# Patient Record
Sex: Female | Born: 1945 | Race: White | Hispanic: No | Marital: Married | Smoking: Never smoker
Health system: Southern US, Community
[De-identification: ages and names within clinical notes are randomized; demographics above are authoritative.]

## PROBLEM LIST (undated history)

## (undated) DIAGNOSIS — H409 Unspecified glaucoma: Secondary | ICD-10-CM

## (undated) DIAGNOSIS — G20 Parkinson's disease: Secondary | ICD-10-CM

## (undated) DIAGNOSIS — G20A1 Parkinson's disease without dyskinesia, without mention of fluctuations: Secondary | ICD-10-CM

## (undated) DIAGNOSIS — G450 Vertebro-basilar artery syndrome: Secondary | ICD-10-CM

## (undated) HISTORY — PX: OTHER SURGICAL HISTORY: SHX169

## (undated) HISTORY — PX: DILATION AND CURETTAGE OF UTERUS: SHX78

## (undated) HISTORY — DX: Parkinson's disease without dyskinesia, without mention of fluctuations: G20.A1

## (undated) HISTORY — PX: ABDOMINAL HYSTERECTOMY: SHX81

## (undated) HISTORY — PX: BREAST BIOPSY: SHX20

## (undated) HISTORY — PX: TONSILLECTOMY: SUR1361

## (undated) HISTORY — DX: Parkinson's disease: G20

## (undated) HISTORY — DX: Unspecified glaucoma: H40.9

## (undated) HISTORY — DX: Vertebro-basilar artery syndrome: G45.0

---

## 1992-05-15 HISTORY — PX: BREAST EXCISIONAL BIOPSY: SUR124

## 1993-05-15 HISTORY — PX: BREAST EXCISIONAL BIOPSY: SUR124

## 1998-07-06 ENCOUNTER — Inpatient Hospital Stay (HOSPITAL_COMMUNITY): Admission: AD | Admit: 1998-07-06 | Discharge: 1998-07-08 | Payer: Self-pay | Admitting: *Deleted

## 1998-07-06 ENCOUNTER — Encounter: Payer: Self-pay | Admitting: *Deleted

## 1998-07-07 ENCOUNTER — Encounter: Payer: Self-pay | Admitting: *Deleted

## 2000-02-22 ENCOUNTER — Encounter: Admission: RE | Admit: 2000-02-22 | Discharge: 2000-02-22 | Payer: Self-pay | Admitting: General Surgery

## 2000-02-22 ENCOUNTER — Encounter: Payer: Self-pay | Admitting: General Surgery

## 2000-03-01 ENCOUNTER — Other Ambulatory Visit: Admission: RE | Admit: 2000-03-01 | Discharge: 2000-03-01 | Payer: Self-pay | Admitting: Gynecology

## 2001-02-20 ENCOUNTER — Encounter: Admission: RE | Admit: 2001-02-20 | Discharge: 2001-02-20 | Payer: Self-pay | Admitting: Gynecology

## 2001-02-20 ENCOUNTER — Encounter: Payer: Self-pay | Admitting: Gynecology

## 2002-01-22 ENCOUNTER — Encounter: Admission: RE | Admit: 2002-01-22 | Discharge: 2002-01-22 | Payer: Self-pay | Admitting: Gynecology

## 2002-01-22 ENCOUNTER — Encounter: Payer: Self-pay | Admitting: Gynecology

## 2003-01-27 ENCOUNTER — Encounter: Payer: Self-pay | Admitting: Gynecology

## 2003-01-27 ENCOUNTER — Encounter: Admission: RE | Admit: 2003-01-27 | Discharge: 2003-01-27 | Payer: Self-pay | Admitting: Gynecology

## 2003-02-03 ENCOUNTER — Other Ambulatory Visit: Admission: RE | Admit: 2003-02-03 | Discharge: 2003-02-03 | Payer: Self-pay | Admitting: Obstetrics and Gynecology

## 2004-01-28 ENCOUNTER — Encounter: Admission: RE | Admit: 2004-01-28 | Discharge: 2004-01-28 | Payer: Self-pay | Admitting: Gynecology

## 2005-02-01 ENCOUNTER — Encounter: Admission: RE | Admit: 2005-02-01 | Discharge: 2005-02-01 | Payer: Self-pay | Admitting: Gynecology

## 2005-09-08 ENCOUNTER — Ambulatory Visit: Payer: Self-pay | Admitting: Internal Medicine

## 2006-01-22 ENCOUNTER — Encounter: Admission: RE | Admit: 2006-01-22 | Discharge: 2006-01-22 | Payer: Self-pay | Admitting: Gynecology

## 2007-01-23 ENCOUNTER — Encounter: Admission: RE | Admit: 2007-01-23 | Discharge: 2007-01-23 | Payer: Self-pay | Admitting: Gynecology

## 2008-01-22 ENCOUNTER — Encounter: Admission: RE | Admit: 2008-01-22 | Discharge: 2008-01-22 | Payer: Self-pay | Admitting: Gynecology

## 2009-01-22 ENCOUNTER — Encounter: Admission: RE | Admit: 2009-01-22 | Discharge: 2009-01-22 | Payer: Self-pay | Admitting: Gynecology

## 2010-01-28 ENCOUNTER — Encounter: Admission: RE | Admit: 2010-01-28 | Discharge: 2010-01-28 | Payer: Self-pay | Admitting: Gynecology

## 2010-12-23 ENCOUNTER — Other Ambulatory Visit: Payer: Self-pay | Admitting: Gynecology

## 2010-12-23 DIAGNOSIS — Z1231 Encounter for screening mammogram for malignant neoplasm of breast: Secondary | ICD-10-CM

## 2011-01-30 ENCOUNTER — Ambulatory Visit
Admission: RE | Admit: 2011-01-30 | Discharge: 2011-01-30 | Disposition: A | Discharge status: Home / Self Care | Payer: BC Managed Care – PPO | Source: Ambulatory Visit | Attending: Gynecology | Admitting: Gynecology

## 2011-01-30 DIAGNOSIS — Z1231 Encounter for screening mammogram for malignant neoplasm of breast: Secondary | ICD-10-CM

## 2012-02-08 ENCOUNTER — Other Ambulatory Visit: Payer: Self-pay | Admitting: Gynecology

## 2012-02-08 DIAGNOSIS — Z1231 Encounter for screening mammogram for malignant neoplasm of breast: Secondary | ICD-10-CM

## 2012-02-26 ENCOUNTER — Ambulatory Visit: Payer: BC Managed Care – PPO

## 2012-02-28 ENCOUNTER — Ambulatory Visit
Admission: RE | Admit: 2012-02-28 | Discharge: 2012-02-28 | Disposition: A | Discharge status: Home / Self Care | Payer: BC Managed Care – PPO | Source: Ambulatory Visit | Attending: Gynecology | Admitting: Gynecology

## 2012-02-28 DIAGNOSIS — Z1231 Encounter for screening mammogram for malignant neoplasm of breast: Secondary | ICD-10-CM

## 2012-03-01 ENCOUNTER — Other Ambulatory Visit: Payer: Self-pay | Admitting: Gynecology

## 2012-03-01 DIAGNOSIS — R928 Other abnormal and inconclusive findings on diagnostic imaging of breast: Secondary | ICD-10-CM

## 2012-03-05 ENCOUNTER — Ambulatory Visit
Admission: RE | Admit: 2012-03-05 | Discharge: 2012-03-05 | Disposition: A | Discharge status: Home / Self Care | Payer: BC Managed Care – PPO | Source: Ambulatory Visit | Attending: Gynecology | Admitting: Gynecology

## 2012-03-05 DIAGNOSIS — R928 Other abnormal and inconclusive findings on diagnostic imaging of breast: Secondary | ICD-10-CM

## 2012-10-24 ENCOUNTER — Other Ambulatory Visit: Payer: Self-pay | Admitting: Gynecology

## 2012-10-24 DIAGNOSIS — N644 Mastodynia: Secondary | ICD-10-CM

## 2012-11-05 ENCOUNTER — Ambulatory Visit
Admission: RE | Admit: 2012-11-05 | Discharge: 2012-11-05 | Disposition: A | Discharge status: Home / Self Care | Payer: BC Managed Care – PPO | Source: Ambulatory Visit | Attending: Gynecology | Admitting: Gynecology

## 2012-11-05 DIAGNOSIS — N644 Mastodynia: Secondary | ICD-10-CM

## 2012-11-06 ENCOUNTER — Other Ambulatory Visit: Payer: BC Managed Care – PPO

## 2012-12-23 ENCOUNTER — Encounter: Payer: Self-pay | Admitting: Internal Medicine

## 2013-02-03 ENCOUNTER — Other Ambulatory Visit: Payer: Self-pay

## 2013-02-03 DIAGNOSIS — Z1231 Encounter for screening mammogram for malignant neoplasm of breast: Secondary | ICD-10-CM

## 2013-03-06 ENCOUNTER — Other Ambulatory Visit: Payer: Self-pay | Admitting: Gynecology

## 2013-03-06 DIAGNOSIS — M8580 Other specified disorders of bone density and structure, unspecified site: Secondary | ICD-10-CM

## 2013-03-20 ENCOUNTER — Ambulatory Visit: Payer: BC Managed Care – PPO

## 2013-04-15 ENCOUNTER — Ambulatory Visit
Admission: RE | Admit: 2013-04-15 | Discharge: 2013-04-15 | Disposition: A | Discharge status: Home / Self Care | Payer: Medicare Other | Source: Ambulatory Visit | Attending: Gynecology | Admitting: Gynecology

## 2013-04-15 ENCOUNTER — Ambulatory Visit
Admission: RE | Admit: 2013-04-15 | Discharge: 2013-04-15 | Disposition: A | Discharge status: Home / Self Care | Payer: Medicare Other | Source: Ambulatory Visit

## 2013-04-15 DIAGNOSIS — M8580 Other specified disorders of bone density and structure, unspecified site: Secondary | ICD-10-CM

## 2013-04-15 DIAGNOSIS — Z1231 Encounter for screening mammogram for malignant neoplasm of breast: Secondary | ICD-10-CM

## 2013-10-12 ENCOUNTER — Encounter: Payer: Self-pay | Admitting: Internal Medicine

## 2014-03-25 ENCOUNTER — Other Ambulatory Visit: Payer: Self-pay

## 2014-03-25 DIAGNOSIS — Z1231 Encounter for screening mammogram for malignant neoplasm of breast: Secondary | ICD-10-CM

## 2014-04-16 ENCOUNTER — Encounter (INDEPENDENT_AMBULATORY_CARE_PROVIDER_SITE_OTHER): Payer: Self-pay

## 2014-04-16 ENCOUNTER — Ambulatory Visit
Admission: RE | Admit: 2014-04-16 | Discharge: 2014-04-16 | Disposition: A | Discharge status: Home / Self Care | Payer: Medicare Other | Source: Ambulatory Visit

## 2014-04-16 DIAGNOSIS — Z1231 Encounter for screening mammogram for malignant neoplasm of breast: Secondary | ICD-10-CM

## 2015-02-17 ENCOUNTER — Other Ambulatory Visit: Payer: Self-pay

## 2015-02-17 DIAGNOSIS — Z1231 Encounter for screening mammogram for malignant neoplasm of breast: Secondary | ICD-10-CM

## 2015-04-19 ENCOUNTER — Ambulatory Visit
Admission: RE | Admit: 2015-04-19 | Discharge: 2015-04-19 | Disposition: A | Discharge status: Home / Self Care | Payer: Commercial Managed Care - HMO | Source: Ambulatory Visit

## 2015-04-19 DIAGNOSIS — Z1231 Encounter for screening mammogram for malignant neoplasm of breast: Secondary | ICD-10-CM

## 2015-06-24 DIAGNOSIS — J329 Chronic sinusitis, unspecified: Secondary | ICD-10-CM | POA: Diagnosis not present

## 2015-06-24 DIAGNOSIS — L239 Allergic contact dermatitis, unspecified cause: Secondary | ICD-10-CM | POA: Diagnosis not present

## 2015-09-03 DIAGNOSIS — J329 Chronic sinusitis, unspecified: Secondary | ICD-10-CM | POA: Diagnosis not present

## 2015-09-03 DIAGNOSIS — R609 Edema, unspecified: Secondary | ICD-10-CM | POA: Diagnosis not present

## 2015-09-03 DIAGNOSIS — H6122 Impacted cerumen, left ear: Secondary | ICD-10-CM | POA: Diagnosis not present

## 2015-09-03 DIAGNOSIS — R42 Dizziness and giddiness: Secondary | ICD-10-CM | POA: Diagnosis not present

## 2015-09-15 DIAGNOSIS — R42 Dizziness and giddiness: Secondary | ICD-10-CM | POA: Diagnosis not present

## 2015-09-15 DIAGNOSIS — R609 Edema, unspecified: Secondary | ICD-10-CM | POA: Diagnosis not present

## 2015-09-15 DIAGNOSIS — H10019 Acute follicular conjunctivitis, unspecified eye: Secondary | ICD-10-CM | POA: Diagnosis not present

## 2015-10-04 DIAGNOSIS — H268 Other specified cataract: Secondary | ICD-10-CM | POA: Diagnosis not present

## 2015-11-13 DIAGNOSIS — J0100 Acute maxillary sinusitis, unspecified: Secondary | ICD-10-CM | POA: Diagnosis not present

## 2016-01-10 DIAGNOSIS — H268 Other specified cataract: Secondary | ICD-10-CM | POA: Diagnosis not present

## 2016-01-12 DIAGNOSIS — R2681 Unsteadiness on feet: Secondary | ICD-10-CM | POA: Diagnosis not present

## 2016-01-12 DIAGNOSIS — M810 Age-related osteoporosis without current pathological fracture: Secondary | ICD-10-CM | POA: Diagnosis not present

## 2016-01-12 DIAGNOSIS — Z9181 History of falling: Secondary | ICD-10-CM | POA: Diagnosis not present

## 2016-01-12 DIAGNOSIS — Z0000 Encounter for general adult medical examination without abnormal findings: Secondary | ICD-10-CM | POA: Diagnosis not present

## 2016-01-12 DIAGNOSIS — J329 Chronic sinusitis, unspecified: Secondary | ICD-10-CM | POA: Diagnosis not present

## 2016-01-12 DIAGNOSIS — G44209 Tension-type headache, unspecified, not intractable: Secondary | ICD-10-CM | POA: Diagnosis not present

## 2016-01-12 DIAGNOSIS — B9689 Other specified bacterial agents as the cause of diseases classified elsewhere: Secondary | ICD-10-CM | POA: Diagnosis not present

## 2016-01-12 DIAGNOSIS — Z1389 Encounter for screening for other disorder: Secondary | ICD-10-CM | POA: Diagnosis not present

## 2016-02-15 DIAGNOSIS — Z9181 History of falling: Secondary | ICD-10-CM | POA: Diagnosis not present

## 2016-02-15 DIAGNOSIS — G20 Parkinson's disease: Secondary | ICD-10-CM | POA: Diagnosis not present

## 2016-02-15 DIAGNOSIS — R29898 Other symptoms and signs involving the musculoskeletal system: Secondary | ICD-10-CM | POA: Diagnosis not present

## 2016-02-15 DIAGNOSIS — G450 Vertebro-basilar artery syndrome: Secondary | ICD-10-CM | POA: Diagnosis not present

## 2016-02-15 DIAGNOSIS — I771 Stricture of artery: Secondary | ICD-10-CM | POA: Diagnosis not present

## 2016-02-15 DIAGNOSIS — R2681 Unsteadiness on feet: Secondary | ICD-10-CM | POA: Diagnosis not present

## 2016-03-06 DIAGNOSIS — H268 Other specified cataract: Secondary | ICD-10-CM | POA: Diagnosis not present

## 2016-03-13 ENCOUNTER — Other Ambulatory Visit: Payer: Self-pay | Admitting: Obstetrics and Gynecology

## 2016-03-13 DIAGNOSIS — Z1231 Encounter for screening mammogram for malignant neoplasm of breast: Secondary | ICD-10-CM

## 2016-03-16 DIAGNOSIS — I998 Other disorder of circulatory system: Secondary | ICD-10-CM | POA: Diagnosis not present

## 2016-03-16 DIAGNOSIS — M79605 Pain in left leg: Secondary | ICD-10-CM | POA: Diagnosis not present

## 2016-03-16 DIAGNOSIS — M79604 Pain in right leg: Secondary | ICD-10-CM | POA: Diagnosis not present

## 2016-03-19 DIAGNOSIS — K2900 Acute gastritis without bleeding: Secondary | ICD-10-CM | POA: Diagnosis not present

## 2016-03-19 DIAGNOSIS — R079 Chest pain, unspecified: Secondary | ICD-10-CM | POA: Diagnosis not present

## 2016-03-19 DIAGNOSIS — R109 Unspecified abdominal pain: Secondary | ICD-10-CM | POA: Diagnosis not present

## 2016-03-19 DIAGNOSIS — R1013 Epigastric pain: Secondary | ICD-10-CM | POA: Diagnosis not present

## 2016-04-19 ENCOUNTER — Ambulatory Visit
Admission: RE | Admit: 2016-04-19 | Discharge: 2016-04-19 | Disposition: A | Discharge status: Home / Self Care | Payer: PPO | Source: Ambulatory Visit | Attending: Obstetrics and Gynecology | Admitting: Obstetrics and Gynecology

## 2016-04-19 DIAGNOSIS — Z1231 Encounter for screening mammogram for malignant neoplasm of breast: Secondary | ICD-10-CM | POA: Diagnosis not present

## 2016-05-02 DIAGNOSIS — R1013 Epigastric pain: Secondary | ICD-10-CM | POA: Diagnosis not present

## 2016-05-02 DIAGNOSIS — K219 Gastro-esophageal reflux disease without esophagitis: Secondary | ICD-10-CM | POA: Diagnosis not present

## 2016-05-02 DIAGNOSIS — R112 Nausea with vomiting, unspecified: Secondary | ICD-10-CM | POA: Diagnosis not present

## 2016-05-02 DIAGNOSIS — K5900 Constipation, unspecified: Secondary | ICD-10-CM | POA: Diagnosis not present

## 2016-05-16 DIAGNOSIS — G450 Vertebro-basilar artery syndrome: Secondary | ICD-10-CM | POA: Diagnosis not present

## 2016-05-16 DIAGNOSIS — Z9181 History of falling: Secondary | ICD-10-CM | POA: Diagnosis not present

## 2016-05-16 DIAGNOSIS — G20 Parkinson's disease: Secondary | ICD-10-CM | POA: Diagnosis not present

## 2016-05-16 DIAGNOSIS — R2681 Unsteadiness on feet: Secondary | ICD-10-CM | POA: Diagnosis not present

## 2016-06-14 DIAGNOSIS — R1013 Epigastric pain: Secondary | ICD-10-CM | POA: Diagnosis not present

## 2016-06-14 DIAGNOSIS — K5790 Diverticulosis of intestine, part unspecified, without perforation or abscess without bleeding: Secondary | ICD-10-CM | POA: Diagnosis not present

## 2016-06-14 DIAGNOSIS — K589 Irritable bowel syndrome without diarrhea: Secondary | ICD-10-CM | POA: Diagnosis not present

## 2016-06-14 DIAGNOSIS — K219 Gastro-esophageal reflux disease without esophagitis: Secondary | ICD-10-CM | POA: Diagnosis not present

## 2016-06-14 DIAGNOSIS — K2950 Unspecified chronic gastritis without bleeding: Secondary | ICD-10-CM | POA: Diagnosis not present

## 2016-06-14 DIAGNOSIS — R112 Nausea with vomiting, unspecified: Secondary | ICD-10-CM | POA: Diagnosis not present

## 2016-06-14 DIAGNOSIS — K2900 Acute gastritis without bleeding: Secondary | ICD-10-CM | POA: Diagnosis not present

## 2016-06-14 DIAGNOSIS — K2970 Gastritis, unspecified, without bleeding: Secondary | ICD-10-CM | POA: Diagnosis not present

## 2016-06-14 DIAGNOSIS — D130 Benign neoplasm of esophagus: Secondary | ICD-10-CM | POA: Diagnosis not present

## 2016-06-14 DIAGNOSIS — K5900 Constipation, unspecified: Secondary | ICD-10-CM | POA: Diagnosis not present

## 2016-06-14 DIAGNOSIS — M8580 Other specified disorders of bone density and structure, unspecified site: Secondary | ICD-10-CM | POA: Diagnosis not present

## 2016-06-14 DIAGNOSIS — K317 Polyp of stomach and duodenum: Secondary | ICD-10-CM | POA: Diagnosis not present

## 2016-06-14 DIAGNOSIS — R0789 Other chest pain: Secondary | ICD-10-CM | POA: Diagnosis not present

## 2016-07-10 DIAGNOSIS — H268 Other specified cataract: Secondary | ICD-10-CM | POA: Diagnosis not present

## 2016-08-23 DIAGNOSIS — J40 Bronchitis, not specified as acute or chronic: Secondary | ICD-10-CM | POA: Diagnosis not present

## 2016-08-23 DIAGNOSIS — Z6825 Body mass index (BMI) 25.0-25.9, adult: Secondary | ICD-10-CM | POA: Diagnosis not present

## 2016-08-23 DIAGNOSIS — J329 Chronic sinusitis, unspecified: Secondary | ICD-10-CM | POA: Diagnosis not present

## 2016-08-23 DIAGNOSIS — N951 Menopausal and female climacteric states: Secondary | ICD-10-CM | POA: Diagnosis not present

## 2016-08-24 DIAGNOSIS — S0990XA Unspecified injury of head, initial encounter: Secondary | ICD-10-CM | POA: Diagnosis not present

## 2016-09-04 DIAGNOSIS — H01002 Unspecified blepharitis right lower eyelid: Secondary | ICD-10-CM | POA: Diagnosis not present

## 2016-09-04 DIAGNOSIS — H01004 Unspecified blepharitis left upper eyelid: Secondary | ICD-10-CM | POA: Diagnosis not present

## 2016-09-04 DIAGNOSIS — H01001 Unspecified blepharitis right upper eyelid: Secondary | ICD-10-CM | POA: Diagnosis not present

## 2016-09-04 DIAGNOSIS — H01005 Unspecified blepharitis left lower eyelid: Secondary | ICD-10-CM | POA: Diagnosis not present

## 2016-09-22 DIAGNOSIS — H268 Other specified cataract: Secondary | ICD-10-CM | POA: Diagnosis not present

## 2016-10-22 DIAGNOSIS — J0100 Acute maxillary sinusitis, unspecified: Secondary | ICD-10-CM | POA: Diagnosis not present

## 2016-11-02 DIAGNOSIS — G450 Vertebro-basilar artery syndrome: Secondary | ICD-10-CM | POA: Diagnosis not present

## 2016-11-02 DIAGNOSIS — M533 Sacrococcygeal disorders, not elsewhere classified: Secondary | ICD-10-CM | POA: Diagnosis not present

## 2016-11-02 DIAGNOSIS — R2681 Unsteadiness on feet: Secondary | ICD-10-CM | POA: Diagnosis not present

## 2016-11-02 DIAGNOSIS — G20 Parkinson's disease: Secondary | ICD-10-CM | POA: Diagnosis not present

## 2016-11-08 DIAGNOSIS — J069 Acute upper respiratory infection, unspecified: Secondary | ICD-10-CM | POA: Diagnosis not present

## 2016-11-14 DIAGNOSIS — M6281 Muscle weakness (generalized): Secondary | ICD-10-CM | POA: Diagnosis not present

## 2016-11-14 DIAGNOSIS — M533 Sacrococcygeal disorders, not elsewhere classified: Secondary | ICD-10-CM | POA: Diagnosis not present

## 2016-11-14 DIAGNOSIS — M545 Low back pain: Secondary | ICD-10-CM | POA: Diagnosis not present

## 2016-11-20 DIAGNOSIS — M545 Low back pain: Secondary | ICD-10-CM | POA: Diagnosis not present

## 2016-11-20 DIAGNOSIS — M6281 Muscle weakness (generalized): Secondary | ICD-10-CM | POA: Diagnosis not present

## 2016-11-20 DIAGNOSIS — M533 Sacrococcygeal disorders, not elsewhere classified: Secondary | ICD-10-CM | POA: Diagnosis not present

## 2016-11-23 DIAGNOSIS — M533 Sacrococcygeal disorders, not elsewhere classified: Secondary | ICD-10-CM | POA: Diagnosis not present

## 2016-11-23 DIAGNOSIS — M545 Low back pain: Secondary | ICD-10-CM | POA: Diagnosis not present

## 2016-11-23 DIAGNOSIS — M6281 Muscle weakness (generalized): Secondary | ICD-10-CM | POA: Diagnosis not present

## 2016-11-28 DIAGNOSIS — M545 Low back pain: Secondary | ICD-10-CM | POA: Diagnosis not present

## 2016-11-28 DIAGNOSIS — M6281 Muscle weakness (generalized): Secondary | ICD-10-CM | POA: Diagnosis not present

## 2016-11-28 DIAGNOSIS — M533 Sacrococcygeal disorders, not elsewhere classified: Secondary | ICD-10-CM | POA: Diagnosis not present

## 2016-11-30 DIAGNOSIS — M6281 Muscle weakness (generalized): Secondary | ICD-10-CM | POA: Diagnosis not present

## 2016-11-30 DIAGNOSIS — M545 Low back pain: Secondary | ICD-10-CM | POA: Diagnosis not present

## 2016-11-30 DIAGNOSIS — M533 Sacrococcygeal disorders, not elsewhere classified: Secondary | ICD-10-CM | POA: Diagnosis not present

## 2016-12-05 DIAGNOSIS — M533 Sacrococcygeal disorders, not elsewhere classified: Secondary | ICD-10-CM | POA: Diagnosis not present

## 2016-12-05 DIAGNOSIS — M545 Low back pain: Secondary | ICD-10-CM | POA: Diagnosis not present

## 2016-12-05 DIAGNOSIS — M6281 Muscle weakness (generalized): Secondary | ICD-10-CM | POA: Diagnosis not present

## 2016-12-07 DIAGNOSIS — M6281 Muscle weakness (generalized): Secondary | ICD-10-CM | POA: Diagnosis not present

## 2016-12-07 DIAGNOSIS — M533 Sacrococcygeal disorders, not elsewhere classified: Secondary | ICD-10-CM | POA: Diagnosis not present

## 2016-12-07 DIAGNOSIS — M545 Low back pain: Secondary | ICD-10-CM | POA: Diagnosis not present

## 2016-12-12 DIAGNOSIS — M6281 Muscle weakness (generalized): Secondary | ICD-10-CM | POA: Diagnosis not present

## 2016-12-12 DIAGNOSIS — M533 Sacrococcygeal disorders, not elsewhere classified: Secondary | ICD-10-CM | POA: Diagnosis not present

## 2016-12-12 DIAGNOSIS — M545 Low back pain: Secondary | ICD-10-CM | POA: Diagnosis not present

## 2016-12-14 DIAGNOSIS — M6281 Muscle weakness (generalized): Secondary | ICD-10-CM | POA: Diagnosis not present

## 2016-12-14 DIAGNOSIS — M545 Low back pain: Secondary | ICD-10-CM | POA: Diagnosis not present

## 2016-12-14 DIAGNOSIS — M533 Sacrococcygeal disorders, not elsewhere classified: Secondary | ICD-10-CM | POA: Diagnosis not present

## 2016-12-15 DIAGNOSIS — J0100 Acute maxillary sinusitis, unspecified: Secondary | ICD-10-CM | POA: Diagnosis not present

## 2016-12-15 DIAGNOSIS — R399 Unspecified symptoms and signs involving the genitourinary system: Secondary | ICD-10-CM | POA: Diagnosis not present

## 2016-12-15 DIAGNOSIS — G44229 Chronic tension-type headache, not intractable: Secondary | ICD-10-CM | POA: Diagnosis not present

## 2016-12-18 DIAGNOSIS — M533 Sacrococcygeal disorders, not elsewhere classified: Secondary | ICD-10-CM | POA: Diagnosis not present

## 2016-12-18 DIAGNOSIS — M6281 Muscle weakness (generalized): Secondary | ICD-10-CM | POA: Diagnosis not present

## 2016-12-18 DIAGNOSIS — M545 Low back pain: Secondary | ICD-10-CM | POA: Diagnosis not present

## 2016-12-19 ENCOUNTER — Encounter: Payer: Self-pay | Admitting: Neurology

## 2016-12-21 DIAGNOSIS — M6281 Muscle weakness (generalized): Secondary | ICD-10-CM | POA: Diagnosis not present

## 2016-12-21 DIAGNOSIS — M533 Sacrococcygeal disorders, not elsewhere classified: Secondary | ICD-10-CM | POA: Diagnosis not present

## 2016-12-21 DIAGNOSIS — M545 Low back pain: Secondary | ICD-10-CM | POA: Diagnosis not present

## 2016-12-25 DIAGNOSIS — M545 Low back pain: Secondary | ICD-10-CM | POA: Diagnosis not present

## 2016-12-25 DIAGNOSIS — M6281 Muscle weakness (generalized): Secondary | ICD-10-CM | POA: Diagnosis not present

## 2016-12-25 DIAGNOSIS — M533 Sacrococcygeal disorders, not elsewhere classified: Secondary | ICD-10-CM | POA: Diagnosis not present

## 2016-12-28 DIAGNOSIS — M545 Low back pain: Secondary | ICD-10-CM | POA: Diagnosis not present

## 2016-12-28 DIAGNOSIS — M533 Sacrococcygeal disorders, not elsewhere classified: Secondary | ICD-10-CM | POA: Diagnosis not present

## 2016-12-28 DIAGNOSIS — M6281 Muscle weakness (generalized): Secondary | ICD-10-CM | POA: Diagnosis not present

## 2017-01-01 DIAGNOSIS — M533 Sacrococcygeal disorders, not elsewhere classified: Secondary | ICD-10-CM | POA: Diagnosis not present

## 2017-01-01 DIAGNOSIS — M545 Low back pain: Secondary | ICD-10-CM | POA: Diagnosis not present

## 2017-01-01 DIAGNOSIS — M6281 Muscle weakness (generalized): Secondary | ICD-10-CM | POA: Diagnosis not present

## 2017-01-04 DIAGNOSIS — M545 Low back pain: Secondary | ICD-10-CM | POA: Diagnosis not present

## 2017-01-04 DIAGNOSIS — M533 Sacrococcygeal disorders, not elsewhere classified: Secondary | ICD-10-CM | POA: Diagnosis not present

## 2017-01-04 DIAGNOSIS — M6281 Muscle weakness (generalized): Secondary | ICD-10-CM | POA: Diagnosis not present

## 2017-01-08 DIAGNOSIS — H01004 Unspecified blepharitis left upper eyelid: Secondary | ICD-10-CM | POA: Diagnosis not present

## 2017-01-08 DIAGNOSIS — H01001 Unspecified blepharitis right upper eyelid: Secondary | ICD-10-CM | POA: Diagnosis not present

## 2017-01-08 DIAGNOSIS — H01002 Unspecified blepharitis right lower eyelid: Secondary | ICD-10-CM | POA: Diagnosis not present

## 2017-01-08 DIAGNOSIS — H01005 Unspecified blepharitis left lower eyelid: Secondary | ICD-10-CM | POA: Diagnosis not present

## 2017-01-09 DIAGNOSIS — M6281 Muscle weakness (generalized): Secondary | ICD-10-CM | POA: Diagnosis not present

## 2017-01-09 DIAGNOSIS — M533 Sacrococcygeal disorders, not elsewhere classified: Secondary | ICD-10-CM | POA: Diagnosis not present

## 2017-01-09 DIAGNOSIS — M545 Low back pain: Secondary | ICD-10-CM | POA: Diagnosis not present

## 2017-01-11 DIAGNOSIS — M545 Low back pain: Secondary | ICD-10-CM | POA: Diagnosis not present

## 2017-01-11 DIAGNOSIS — M6281 Muscle weakness (generalized): Secondary | ICD-10-CM | POA: Diagnosis not present

## 2017-01-11 DIAGNOSIS — M533 Sacrococcygeal disorders, not elsewhere classified: Secondary | ICD-10-CM | POA: Diagnosis not present

## 2017-01-16 DIAGNOSIS — M545 Low back pain: Secondary | ICD-10-CM | POA: Diagnosis not present

## 2017-01-16 DIAGNOSIS — M6281 Muscle weakness (generalized): Secondary | ICD-10-CM | POA: Diagnosis not present

## 2017-01-16 DIAGNOSIS — M533 Sacrococcygeal disorders, not elsewhere classified: Secondary | ICD-10-CM | POA: Diagnosis not present

## 2017-01-17 ENCOUNTER — Encounter: Payer: Self-pay | Admitting: Neurology

## 2017-01-18 DIAGNOSIS — M533 Sacrococcygeal disorders, not elsewhere classified: Secondary | ICD-10-CM | POA: Diagnosis not present

## 2017-01-18 DIAGNOSIS — M545 Low back pain: Secondary | ICD-10-CM | POA: Diagnosis not present

## 2017-01-18 DIAGNOSIS — M6281 Muscle weakness (generalized): Secondary | ICD-10-CM | POA: Diagnosis not present
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
## 2017-01-22 ENCOUNTER — Encounter: Payer: Self-pay | Admitting: Neurology

## 2017-01-22 ENCOUNTER — Ambulatory Visit (INDEPENDENT_AMBULATORY_CARE_PROVIDER_SITE_OTHER): Payer: PPO | Admitting: Neurology

## 2017-01-22 VITALS — BP 138/78 | HR 84 | Ht 61.0 in | Wt 144.0 lb

## 2017-01-22 DIAGNOSIS — G20 Parkinson's disease: Secondary | ICD-10-CM | POA: Diagnosis not present

## 2017-01-22 DIAGNOSIS — M542 Cervicalgia: Secondary | ICD-10-CM

## 2017-01-22 DIAGNOSIS — N39498 Other specified urinary incontinence: Secondary | ICD-10-CM | POA: Diagnosis not present

## 2017-01-22 MED ORDER — CARBIDOPA-LEVODOPA ER 25-100 MG PO TBCR: 1.0000 | EXTENDED_RELEASE_TABLET | Freq: Three times a day (TID) | ORAL | 1 refills | Status: DC

## 2017-01-22 NOTE — Patient Instructions (Addendum)
1.  Start carbidopa/levodopa 25/100 CR as follows:    -take 1 tablet in the AM for 1 week.   After 1 week, increase to 1 tablet 30 min before breakfast and 1 tablet 30 min before dinner.  Do that for 2 weeks.  After that, increase the medication so that you are taking carbidopa/levodopa CR 1 tablet 30 minutes before each meal  2.  Please check out rock steady boxing in [place].    3.  We will add PT to the neck at your request.  I suspect that stiffness will get much better after you start on medication  4.  Let us know if you change your mind on the referral to urology for your bladder

## 2017-01-23 DIAGNOSIS — M6281 Muscle weakness (generalized): Secondary | ICD-10-CM | POA: Diagnosis not present

## 2017-01-23 DIAGNOSIS — M545 Low back pain: Secondary | ICD-10-CM | POA: Diagnosis not present

## 2017-01-23 DIAGNOSIS — M533 Sacrococcygeal disorders, not elsewhere classified: Secondary | ICD-10-CM | POA: Diagnosis not present

## 2017-02-01 DIAGNOSIS — M533 Sacrococcygeal disorders, not elsewhere classified: Secondary | ICD-10-CM | POA: Diagnosis not present

## 2017-02-01 DIAGNOSIS — M545 Low back pain: Secondary | ICD-10-CM | POA: Diagnosis not present

## 2017-02-01 DIAGNOSIS — M6281 Muscle weakness (generalized): Secondary | ICD-10-CM | POA: Diagnosis not present

## 2017-02-02 ENCOUNTER — Telehealth: Payer: Self-pay | Admitting: Neurology

## 2017-02-02 NOTE — Telephone Encounter (Signed)
Spoke with patient. She has tried Carbidopa Levodopa 25/100 CR one tablet daily for one week.  Patient states that she cut the first tablet in half and had dizziness. I advised this may be from splitting an extended release tablet.  She then states that day two she started having swelling in her feet and toes that has persisted. She feels "crazy-headed" which she explains as feeling "dopey", feels more stiff and rigid, and having issues with dry eyes. She states all these symptoms weren't present before she started medication and believes they stem from Levodopa.   I advised these symptoms do not sound related to her medication but that if she feels uncomfortable taking the medication that she should stop it.  She wants an alternative, but it looks like she has experienced allergies/intolerances to alternative medications.   I did tell her taking one tablet daily would not alleviate PD symptoms and rigid/stiffness and balance would not be adequately treated yet (if It helps).   She is not sure what to do and states she will follow Dr. Doristine Douglas instructions as to stop medication or continue with the titration.   Please advise.

## 2017-02-02 NOTE — Telephone Encounter (Signed)
Patient needs to talk to someone about the carbidopa levodopa she state that she was dizzy and her feet are numb and tingling  and swelling. She is having trouble with her balance and her left hand is drawn

## 2017-02-02 NOTE — Telephone Encounter (Signed)
I agree that these don't sound like symptoms associated with levodopa.  However, given intolerances to multiple meds, I am not sure that we will get her "used" to this.  She can d/c.  Can give her 2mg  neupro patch samples, although insurance likely won't pay if helps and will have to appeal.

## 2017-02-05 MED ORDER — ROTIGOTINE 2 MG/24HR TD PT24: 1.0000 | MEDICATED_PATCH | Freq: Every day | TRANSDERMAL | 0 refills | Status: DC

## 2017-02-05 NOTE — Telephone Encounter (Signed)
Patient made aware.   She is not sure if she can come to the office to pick it up as she lives in [place].   I will leave it at the front, if she would rather me send a prescription she will let me know.

## 2017-02-08 DIAGNOSIS — M533 Sacrococcygeal disorders, not elsewhere classified: Secondary | ICD-10-CM | POA: Diagnosis not present

## 2017-02-08 DIAGNOSIS — M545 Low back pain: Secondary | ICD-10-CM | POA: Diagnosis not present

## 2017-02-08 DIAGNOSIS — M6281 Muscle weakness (generalized): Secondary | ICD-10-CM | POA: Diagnosis not present

## 2017-02-12 DIAGNOSIS — M545 Low back pain: Secondary | ICD-10-CM | POA: Diagnosis not present

## 2017-02-12 DIAGNOSIS — M533 Sacrococcygeal disorders, not elsewhere classified: Secondary | ICD-10-CM | POA: Diagnosis not present

## 2017-02-12 DIAGNOSIS — M6281 Muscle weakness (generalized): Secondary | ICD-10-CM | POA: Diagnosis not present

## 2017-02-15 DIAGNOSIS — M6281 Muscle weakness (generalized): Secondary | ICD-10-CM | POA: Diagnosis not present

## 2017-02-15 DIAGNOSIS — M545 Low back pain: Secondary | ICD-10-CM | POA: Diagnosis not present

## 2017-02-15 DIAGNOSIS — M533 Sacrococcygeal disorders, not elsewhere classified: Secondary | ICD-10-CM | POA: Diagnosis not present

## 2017-02-28 DIAGNOSIS — M6281 Muscle weakness (generalized): Secondary | ICD-10-CM | POA: Diagnosis not present

## 2017-02-28 DIAGNOSIS — M533 Sacrococcygeal disorders, not elsewhere classified: Secondary | ICD-10-CM | POA: Diagnosis not present

## 2017-02-28 DIAGNOSIS — M545 Low back pain: Secondary | ICD-10-CM | POA: Diagnosis not present

## 2017-03-05 DIAGNOSIS — M6281 Muscle weakness (generalized): Secondary | ICD-10-CM | POA: Diagnosis not present

## 2017-03-05 DIAGNOSIS — M533 Sacrococcygeal disorders, not elsewhere classified: Secondary | ICD-10-CM | POA: Diagnosis not present

## 2017-03-05 DIAGNOSIS — M545 Low back pain: Secondary | ICD-10-CM | POA: Diagnosis not present

## 2017-03-21 DIAGNOSIS — M533 Sacrococcygeal disorders, not elsewhere classified: Secondary | ICD-10-CM | POA: Diagnosis not present

## 2017-03-21 DIAGNOSIS — M545 Low back pain: Secondary | ICD-10-CM | POA: Diagnosis not present

## 2017-03-21 DIAGNOSIS — M6281 Muscle weakness (generalized): Secondary | ICD-10-CM | POA: Diagnosis not present

## 2017-03-28 DIAGNOSIS — M6281 Muscle weakness (generalized): Secondary | ICD-10-CM | POA: Diagnosis not present

## 2017-03-28 DIAGNOSIS — M545 Low back pain: Secondary | ICD-10-CM | POA: Diagnosis not present

## 2017-03-28 DIAGNOSIS — M533 Sacrococcygeal disorders, not elsewhere classified: Secondary | ICD-10-CM | POA: Diagnosis not present

## 2017-04-11 DIAGNOSIS — M545 Low back pain: Secondary | ICD-10-CM | POA: Diagnosis not present

## 2017-04-11 DIAGNOSIS — M6281 Muscle weakness (generalized): Secondary | ICD-10-CM | POA: Diagnosis not present

## 2017-04-11 DIAGNOSIS — M533 Sacrococcygeal disorders, not elsewhere classified: Secondary | ICD-10-CM | POA: Diagnosis not present

## 2017-04-18 DIAGNOSIS — M6281 Muscle weakness (generalized): Secondary | ICD-10-CM | POA: Diagnosis not present

## 2017-04-18 DIAGNOSIS — M545 Low back pain: Secondary | ICD-10-CM | POA: Diagnosis not present

## 2017-04-18 DIAGNOSIS — M533 Sacrococcygeal disorders, not elsewhere classified: Secondary | ICD-10-CM | POA: Diagnosis not present

## 2017-05-02 DIAGNOSIS — M533 Sacrococcygeal disorders, not elsewhere classified: Secondary | ICD-10-CM | POA: Diagnosis not present

## 2017-05-02 DIAGNOSIS — M545 Low back pain: Secondary | ICD-10-CM | POA: Diagnosis not present

## 2017-05-02 DIAGNOSIS — M6281 Muscle weakness (generalized): Secondary | ICD-10-CM | POA: Diagnosis not present

## 2017-05-06 DIAGNOSIS — R300 Dysuria: Secondary | ICD-10-CM | POA: Diagnosis not present

## 2017-05-06 DIAGNOSIS — N3001 Acute cystitis with hematuria: Secondary | ICD-10-CM | POA: Diagnosis not present

## 2017-05-06 DIAGNOSIS — J209 Acute bronchitis, unspecified: Secondary | ICD-10-CM | POA: Diagnosis not present

## 2017-05-17 DIAGNOSIS — M545 Low back pain: Secondary | ICD-10-CM | POA: Diagnosis not present

## 2017-05-17 DIAGNOSIS — M6281 Muscle weakness (generalized): Secondary | ICD-10-CM | POA: Diagnosis not present

## 2017-05-17 DIAGNOSIS — M533 Sacrococcygeal disorders, not elsewhere classified: Secondary | ICD-10-CM | POA: Diagnosis not present

## 2017-05-23 DIAGNOSIS — H2513 Age-related nuclear cataract, bilateral: Secondary | ICD-10-CM | POA: Diagnosis not present

## 2017-05-24 ENCOUNTER — Other Ambulatory Visit: Payer: Self-pay | Admitting: Neurology

## 2017-05-28 ENCOUNTER — Telehealth: Payer: Self-pay | Admitting: Neurology

## 2017-05-28 MED ORDER — CARBIDOPA-LEVODOPA ER 25-100 MG PO TBCR: 1.0000 | EXTENDED_RELEASE_TABLET | Freq: Three times a day (TID) | ORAL | 0 refills | Status: DC

## 2017-05-28 NOTE — Telephone Encounter (Signed)
Spoke with patient because last note states she stopped Levodopa. She states she stayed on medication and slowly increased to three times a day. She is doing okay on medication. Follow up made with patient and enough medication sent to last until appointment.

## 2017-05-28 NOTE — Telephone Encounter (Signed)
Patient called and needs to have her Carbidopa Levodopa refilled please.She is about out. Thanks

## 2017-05-31 DIAGNOSIS — M533 Sacrococcygeal disorders, not elsewhere classified: Secondary | ICD-10-CM | POA: Diagnosis not present

## 2017-05-31 DIAGNOSIS — M545 Low back pain: Secondary | ICD-10-CM | POA: Diagnosis not present

## 2017-05-31 DIAGNOSIS — M6281 Muscle weakness (generalized): Secondary | ICD-10-CM | POA: Diagnosis not present

## 2017-06-11 DIAGNOSIS — H401131 Primary open-angle glaucoma, bilateral, mild stage: Secondary | ICD-10-CM | POA: Diagnosis not present

## 2017-06-14 DIAGNOSIS — M533 Sacrococcygeal disorders, not elsewhere classified: Secondary | ICD-10-CM | POA: Diagnosis not present

## 2017-06-14 DIAGNOSIS — M6281 Muscle weakness (generalized): Secondary | ICD-10-CM | POA: Diagnosis not present

## 2017-06-14 DIAGNOSIS — M545 Low back pain: Secondary | ICD-10-CM | POA: Diagnosis not present

## 2017-06-28 DIAGNOSIS — M533 Sacrococcygeal disorders, not elsewhere classified: Secondary | ICD-10-CM | POA: Diagnosis not present

## 2017-06-28 DIAGNOSIS — M6281 Muscle weakness (generalized): Secondary | ICD-10-CM | POA: Diagnosis not present

## 2017-06-28 DIAGNOSIS — M545 Low back pain: Secondary | ICD-10-CM | POA: Diagnosis not present

## 2017-07-05 DIAGNOSIS — M6281 Muscle weakness (generalized): Secondary | ICD-10-CM | POA: Diagnosis not present

## 2017-07-05 DIAGNOSIS — M545 Low back pain: Secondary | ICD-10-CM | POA: Diagnosis not present

## 2017-07-05 DIAGNOSIS — M533 Sacrococcygeal disorders, not elsewhere classified: Secondary | ICD-10-CM | POA: Diagnosis not present

## 2017-07-09 DIAGNOSIS — H401131 Primary open-angle glaucoma, bilateral, mild stage: Secondary | ICD-10-CM | POA: Diagnosis not present

## 2017-07-19 DIAGNOSIS — M533 Sacrococcygeal disorders, not elsewhere classified: Secondary | ICD-10-CM | POA: Diagnosis not present

## 2017-07-19 DIAGNOSIS — M545 Low back pain: Secondary | ICD-10-CM | POA: Diagnosis not present

## 2017-07-19 DIAGNOSIS — M6281 Muscle weakness (generalized): Secondary | ICD-10-CM | POA: Diagnosis not present

## 2017-07-24 ENCOUNTER — Other Ambulatory Visit: Payer: Self-pay | Admitting: Family Medicine

## 2017-07-24 DIAGNOSIS — Z1231 Encounter for screening mammogram for malignant neoplasm of breast: Secondary | ICD-10-CM

## 2017-07-26 ENCOUNTER — Ambulatory Visit
Admission: RE | Admit: 2017-07-26 | Discharge: 2017-07-26 | Disposition: A | Discharge status: Home / Self Care | Payer: PPO | Source: Ambulatory Visit | Attending: Family Medicine | Admitting: Family Medicine

## 2017-07-26 DIAGNOSIS — Z1231 Encounter for screening mammogram for malignant neoplasm of breast: Secondary | ICD-10-CM

## 2017-08-02 DIAGNOSIS — M533 Sacrococcygeal disorders, not elsewhere classified: Secondary | ICD-10-CM | POA: Diagnosis not present

## 2017-08-02 DIAGNOSIS — M6281 Muscle weakness (generalized): Secondary | ICD-10-CM | POA: Diagnosis not present

## 2017-08-02 DIAGNOSIS — M545 Low back pain: Secondary | ICD-10-CM | POA: Diagnosis not present

## 2017-08-06 DIAGNOSIS — B880 Other acariasis: Secondary | ICD-10-CM | POA: Diagnosis not present

## 2017-08-09 DIAGNOSIS — M545 Low back pain: Secondary | ICD-10-CM | POA: Diagnosis not present

## 2017-08-09 DIAGNOSIS — M6281 Muscle weakness (generalized): Secondary | ICD-10-CM | POA: Diagnosis not present

## 2017-08-09 DIAGNOSIS — M533 Sacrococcygeal disorders, not elsewhere classified: Secondary | ICD-10-CM | POA: Diagnosis not present

## 2017-08-16 DIAGNOSIS — M545 Low back pain: Secondary | ICD-10-CM | POA: Diagnosis not present

## 2017-08-16 DIAGNOSIS — M533 Sacrococcygeal disorders, not elsewhere classified: Secondary | ICD-10-CM | POA: Diagnosis not present

## 2017-08-16 DIAGNOSIS — M6281 Muscle weakness (generalized): Secondary | ICD-10-CM | POA: Diagnosis not present

## 2017-08-24 DIAGNOSIS — L309 Dermatitis, unspecified: Secondary | ICD-10-CM | POA: Diagnosis not present

## 2017-08-24 DIAGNOSIS — R5381 Other malaise: Secondary | ICD-10-CM | POA: Diagnosis not present

## 2017-08-24 DIAGNOSIS — M5416 Radiculopathy, lumbar region: Secondary | ICD-10-CM | POA: Diagnosis not present

## 2017-08-24 DIAGNOSIS — H6122 Impacted cerumen, left ear: Secondary | ICD-10-CM | POA: Diagnosis not present

## 2017-08-24 DIAGNOSIS — Z0000 Encounter for general adult medical examination without abnormal findings: Secondary | ICD-10-CM | POA: Diagnosis not present

## 2017-08-24 DIAGNOSIS — R350 Frequency of micturition: Secondary | ICD-10-CM | POA: Diagnosis not present

## 2017-08-24 DIAGNOSIS — R5383 Other fatigue: Secondary | ICD-10-CM | POA: Diagnosis not present

## 2017-08-27 ENCOUNTER — Other Ambulatory Visit: Payer: Self-pay | Admitting: Neurology

## 2017-08-27 ENCOUNTER — Telehealth: Payer: Self-pay | Admitting: Neurology

## 2017-08-27 MED ORDER — CARBIDOPA-LEVODOPA ER 25-100 MG PO TBCR: 1.0000 | EXTENDED_RELEASE_TABLET | Freq: Three times a day (TID) | ORAL | 0 refills | Status: DC

## 2017-08-27 NOTE — Telephone Encounter (Signed)
Patient states that she ran out of the medication carbidopa levodopa on Saturday and the pharmacy states we would not refill it and she wants to talk to [facility] about why we would not refill she has appt on 09-10-17

## 2017-08-27 NOTE — Telephone Encounter (Signed)
Patient aware we have sent a 30 day supply in to [facility]. 90 day supply denied. She will keep follow up appt.

## 2017-09-03 DIAGNOSIS — B880 Other acariasis: Secondary | ICD-10-CM | POA: Diagnosis not present
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
## 2017-09-10 ENCOUNTER — Ambulatory Visit: Payer: PPO | Admitting: Neurology

## 2017-09-10 ENCOUNTER — Encounter: Payer: Self-pay | Admitting: Neurology

## 2017-09-10 ENCOUNTER — Other Ambulatory Visit: Payer: Self-pay

## 2017-09-10 VITALS — BP 142/64 | HR 88 | Ht 61.0 in | Wt 141.0 lb

## 2017-09-10 DIAGNOSIS — G20 Parkinson's disease: Secondary | ICD-10-CM

## 2017-09-10 DIAGNOSIS — N39498 Other specified urinary incontinence: Secondary | ICD-10-CM | POA: Diagnosis not present

## 2017-09-10 MED ORDER — CARBIDOPA-LEVODOPA ER 25-100 MG PO TBCR
1.0000 | EXTENDED_RELEASE_TABLET | Freq: Four times a day (QID) | ORAL | 1 refills | Status: DC
Start: 2017-09-10 — End: 2018-05-02

## 2017-09-10 NOTE — Patient Instructions (Addendum)
Take carbidopa/levodopa 25/100 CR at 9am/noon/3pm/6pm.    Drink plenty of water (over 60 oz per day)  Registration is OPEN!    Third Annual Parkinson's Education Symposium   To register: [email or web address]      Search:  [employer] person attending individually Questions: [place], [place] or [email or web address]

## 2017-09-13 DIAGNOSIS — M6281 Muscle weakness (generalized): Secondary | ICD-10-CM | POA: Diagnosis not present

## 2017-09-13 DIAGNOSIS — M533 Sacrococcygeal disorders, not elsewhere classified: Secondary | ICD-10-CM | POA: Diagnosis not present

## 2017-09-13 DIAGNOSIS — M545 Low back pain: Secondary | ICD-10-CM | POA: Diagnosis not present

## 2017-09-19 DIAGNOSIS — H25813 Combined forms of age-related cataract, bilateral: Secondary | ICD-10-CM | POA: Diagnosis not present

## 2017-09-27 DIAGNOSIS — M6281 Muscle weakness (generalized): Secondary | ICD-10-CM | POA: Diagnosis not present

## 2017-09-27 DIAGNOSIS — M545 Low back pain: Secondary | ICD-10-CM | POA: Diagnosis not present

## 2017-09-27 DIAGNOSIS — M533 Sacrococcygeal disorders, not elsewhere classified: Secondary | ICD-10-CM | POA: Diagnosis not present

## 2017-10-11 DIAGNOSIS — M545 Low back pain: Secondary | ICD-10-CM | POA: Diagnosis not present

## 2017-10-11 DIAGNOSIS — M6281 Muscle weakness (generalized): Secondary | ICD-10-CM | POA: Diagnosis not present

## 2017-10-11 DIAGNOSIS — M533 Sacrococcygeal disorders, not elsewhere classified: Secondary | ICD-10-CM | POA: Diagnosis not present

## 2017-10-22 DIAGNOSIS — M545 Low back pain: Secondary | ICD-10-CM | POA: Diagnosis not present

## 2017-10-22 DIAGNOSIS — M6281 Muscle weakness (generalized): Secondary | ICD-10-CM | POA: Diagnosis not present

## 2017-10-22 DIAGNOSIS — M533 Sacrococcygeal disorders, not elsewhere classified: Secondary | ICD-10-CM | POA: Diagnosis not present

## 2017-10-25 DIAGNOSIS — M533 Sacrococcygeal disorders, not elsewhere classified: Secondary | ICD-10-CM | POA: Diagnosis not present

## 2017-10-25 DIAGNOSIS — M545 Low back pain: Secondary | ICD-10-CM | POA: Diagnosis not present

## 2017-10-25 DIAGNOSIS — M6281 Muscle weakness (generalized): Secondary | ICD-10-CM | POA: Diagnosis not present

## 2017-10-29 DIAGNOSIS — M533 Sacrococcygeal disorders, not elsewhere classified: Secondary | ICD-10-CM | POA: Diagnosis not present

## 2017-10-29 DIAGNOSIS — M545 Low back pain: Secondary | ICD-10-CM | POA: Diagnosis not present

## 2017-10-29 DIAGNOSIS — M6281 Muscle weakness (generalized): Secondary | ICD-10-CM | POA: Diagnosis not present

## 2017-11-01 DIAGNOSIS — M6281 Muscle weakness (generalized): Secondary | ICD-10-CM | POA: Diagnosis not present

## 2017-11-01 DIAGNOSIS — M533 Sacrococcygeal disorders, not elsewhere classified: Secondary | ICD-10-CM | POA: Diagnosis not present

## 2017-11-01 DIAGNOSIS — M545 Low back pain: Secondary | ICD-10-CM | POA: Diagnosis not present

## 2017-11-05 DIAGNOSIS — B880 Other acariasis: Secondary | ICD-10-CM | POA: Diagnosis not present

## 2017-12-03 DIAGNOSIS — B880 Other acariasis: Secondary | ICD-10-CM | POA: Diagnosis not present

## 2018-01-07 DIAGNOSIS — B880 Other acariasis: Secondary | ICD-10-CM | POA: Diagnosis not present
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
## 2018-01-17 ENCOUNTER — Ambulatory Visit (INDEPENDENT_AMBULATORY_CARE_PROVIDER_SITE_OTHER): Payer: PPO | Admitting: Neurology

## 2018-01-17 ENCOUNTER — Ambulatory Visit
Admission: RE | Admit: 2018-01-17 | Discharge: 2018-01-17 | Disposition: A | Discharge status: Home / Self Care | Payer: PPO | Source: Ambulatory Visit | Attending: Neurology | Admitting: Neurology

## 2018-01-17 ENCOUNTER — Encounter: Payer: Self-pay | Admitting: Neurology

## 2018-01-17 VITALS — BP 126/78 | HR 86 | Ht 61.0 in | Wt 144.0 lb

## 2018-01-17 DIAGNOSIS — R270 Ataxia, unspecified: Secondary | ICD-10-CM

## 2018-01-17 DIAGNOSIS — S0990XA Unspecified injury of head, initial encounter: Secondary | ICD-10-CM | POA: Diagnosis not present

## 2018-01-17 DIAGNOSIS — W19XXXA Unspecified fall, initial encounter: Secondary | ICD-10-CM

## 2018-01-17 DIAGNOSIS — G20 Parkinson's disease: Secondary | ICD-10-CM | POA: Diagnosis not present

## 2018-01-17 MED ORDER — AMBULATORY NON FORMULARY MEDICATION: 0 refills | Status: DC

## 2018-01-17 NOTE — Patient Instructions (Addendum)
Medical equipment and supply Address: [address], [place], [place] [address] Phone: [phone]  We have you scheduled for on/off testing. Stop taking Carbidopa Levodopa the evening before your appt (No not take your last dose of the day) or the day of your appt.

## 2018-01-18 ENCOUNTER — Telehealth: Payer: Self-pay | Admitting: Neurology

## 2018-01-18 DIAGNOSIS — G44229 Chronic tension-type headache, not intractable: Secondary | ICD-10-CM | POA: Diagnosis not present

## 2018-01-18 DIAGNOSIS — R05 Cough: Secondary | ICD-10-CM | POA: Diagnosis not present

## 2018-01-18 DIAGNOSIS — R609 Edema, unspecified: Secondary | ICD-10-CM | POA: Diagnosis not present

## 2018-01-18 DIAGNOSIS — N3001 Acute cystitis with hematuria: Secondary | ICD-10-CM | POA: Diagnosis not present

## 2018-01-18 NOTE — Telephone Encounter (Signed)
Patient made aware of results.  

## 2018-01-18 NOTE — Telephone Encounter (Signed)
Left message on machine for patient to call back.

## 2018-01-18 NOTE — Telephone Encounter (Signed)
-----   Message from Fallon, DO sent at 01/18/2018 10:07 AM EDT ----- Let pt know that CT was okay

## 2018-01-29 DIAGNOSIS — R600 Localized edema: Secondary | ICD-10-CM | POA: Diagnosis not present

## 2018-01-29 DIAGNOSIS — R609 Edema, unspecified: Secondary | ICD-10-CM | POA: Diagnosis not present

## 2018-01-29 DIAGNOSIS — N3000 Acute cystitis without hematuria: Secondary | ICD-10-CM | POA: Diagnosis not present

## 2018-02-04 DIAGNOSIS — H0100A Unspecified blepharitis right eye, upper and lower eyelids: Secondary | ICD-10-CM | POA: Diagnosis not present

## 2018-02-04 DIAGNOSIS — H0100B Unspecified blepharitis left eye, upper and lower eyelids: Secondary | ICD-10-CM | POA: Diagnosis not present

## 2018-02-05 ENCOUNTER — Telehealth: Payer: Self-pay | Admitting: Neurology

## 2018-02-05 NOTE — Telephone Encounter (Signed)
Spoke with patient and made her aware no restrictions on cold medications. Only some PD meds interact with cold medication and the one she is taking is fine.

## 2018-02-05 NOTE — Telephone Encounter (Signed)
Patient is calling in wanting to know which cold/cough medication she can take with her parkinson's medication. Please call her back at [phone] or [phone]. Thanks!

## 2018-02-25 DIAGNOSIS — B353 Tinea pedis: Secondary | ICD-10-CM | POA: Diagnosis not present

## 2018-02-25 DIAGNOSIS — M2041 Other hammer toe(s) (acquired), right foot: Secondary | ICD-10-CM | POA: Diagnosis not present

## 2018-02-25 DIAGNOSIS — B351 Tinea unguium: Secondary | ICD-10-CM | POA: Diagnosis not present

## 2018-02-25 DIAGNOSIS — I739 Peripheral vascular disease, unspecified: Secondary | ICD-10-CM | POA: Diagnosis not present

## 2018-02-27 ENCOUNTER — Telehealth: Payer: Self-pay | Admitting: Neurology

## 2018-02-27 NOTE — Telephone Encounter (Signed)
Patient called regarding her appointment with Dr. Carles Collet on 03/07/18. She is needing to cancel due to an Eye appt. Should this On/off appt be scheduled at a certain time? Thanks

## 2018-02-27 NOTE — Telephone Encounter (Signed)
It has to be scheduled in a follow up spot, with a follow up after it, and the follow up after that has to be blocked so that she can see the patient again. This will probably have to be pushed out til February if they reschedule, so please make them aware of that.

## 2018-02-27 NOTE — Telephone Encounter (Signed)
Okay I called patient and she is scheduled for 08/01/18. Will you make sure the time looks ok? Thanks

## 2018-02-28 NOTE — Telephone Encounter (Signed)
Scheduling discussed with Brandi. She is calling patient to reschedule.

## 2018-03-01 NOTE — Telephone Encounter (Signed)
Patient called back - she wanted to come in around lunch time, like Dr. Carles Collet had worked her in before. I let her know unfortunately I do not have any templated time for this in her schedule and can not accomodate that.

## 2018-03-07 ENCOUNTER — Telehealth: Payer: Self-pay | Admitting: Neurology

## 2018-03-07 ENCOUNTER — Ambulatory Visit: Payer: PPO | Admitting: Neurology

## 2018-03-07 DIAGNOSIS — B880 Other acariasis: Secondary | ICD-10-CM | POA: Diagnosis not present

## 2018-03-07 NOTE — Telephone Encounter (Signed)
Patient called and she is okay with her new appointment time. She would like you to please call her with the directions of how to take her medication prior to her on/ off appointment? Also she went to get a walker that Dr. Carles Collet had recommended and they did not have one like she needed. Where else should she go to get one? Thanks

## 2018-03-08 NOTE — Telephone Encounter (Signed)
Spoke with patient. Advised to not take last Levodopa of the evening prior to on/off test.  She states could not find walker at medical supply in [place]. She will try medical supply in [place] and call with any issues.

## 2018-03-18 DIAGNOSIS — Z01818 Encounter for other preprocedural examination: Secondary | ICD-10-CM | POA: Diagnosis not present

## 2018-03-18 DIAGNOSIS — H2589 Other age-related cataract: Secondary | ICD-10-CM | POA: Diagnosis not present

## 2018-03-19 DIAGNOSIS — B351 Tinea unguium: Secondary | ICD-10-CM | POA: Diagnosis not present

## 2018-03-19 DIAGNOSIS — I739 Peripheral vascular disease, unspecified: Secondary | ICD-10-CM | POA: Diagnosis not present

## 2018-03-19 DIAGNOSIS — B353 Tinea pedis: Secondary | ICD-10-CM | POA: Diagnosis not present

## 2018-03-19 DIAGNOSIS — M2041 Other hammer toe(s) (acquired), right foot: Secondary | ICD-10-CM | POA: Diagnosis not present

## 2018-03-22 ENCOUNTER — Telehealth: Payer: Self-pay | Admitting: Neurology

## 2018-03-22 NOTE — Telephone Encounter (Signed)
Please advise 

## 2018-03-22 NOTE — Telephone Encounter (Signed)
Patient states she is suppose to be having cataract surgery next Wednesday 11.13.19 and wants to know which anesthesia would be best for her since she has parkinson's. Patient states she usually uses propofol but they mentioned versed. Patient wanting Dr.Tat's opinion.

## 2018-03-25 NOTE — Telephone Encounter (Signed)
Either is fine with PD and up to the surgeon.  Both are very short acting and not really GA so don't interfere with PD

## 2018-03-25 NOTE — Telephone Encounter (Signed)
Patient made aware.

## 2018-03-25 NOTE — Telephone Encounter (Signed)
Left message on machine for patient to call back.

## 2018-04-17 DIAGNOSIS — H2589 Other age-related cataract: Secondary | ICD-10-CM | POA: Diagnosis not present

## 2018-04-17 DIAGNOSIS — H2512 Age-related nuclear cataract, left eye: Secondary | ICD-10-CM | POA: Diagnosis not present

## 2018-04-17 DIAGNOSIS — H25812 Combined forms of age-related cataract, left eye: Secondary | ICD-10-CM | POA: Diagnosis not present

## 2018-04-17 DIAGNOSIS — H401121 Primary open-angle glaucoma, left eye, mild stage: Secondary | ICD-10-CM | POA: Diagnosis not present

## 2018-04-17 DIAGNOSIS — H409 Unspecified glaucoma: Secondary | ICD-10-CM | POA: Diagnosis not present

## 2018-05-02 ENCOUNTER — Other Ambulatory Visit: Payer: Self-pay | Admitting: Neurology

## 2018-05-22 DIAGNOSIS — N3001 Acute cystitis with hematuria: Secondary | ICD-10-CM | POA: Diagnosis not present

## 2018-05-22 DIAGNOSIS — N3090 Cystitis, unspecified without hematuria: Secondary | ICD-10-CM | POA: Diagnosis not present

## 2018-07-10 DIAGNOSIS — H25811 Combined forms of age-related cataract, right eye: Secondary | ICD-10-CM | POA: Diagnosis not present

## 2018-07-10 DIAGNOSIS — H2511 Age-related nuclear cataract, right eye: Secondary | ICD-10-CM | POA: Diagnosis not present

## 2018-07-10 DIAGNOSIS — H401111 Primary open-angle glaucoma, right eye, mild stage: Secondary | ICD-10-CM | POA: Diagnosis not present

## 2018-07-23 ENCOUNTER — Telehealth: Payer: Self-pay | Admitting: Neurology

## 2018-07-23 NOTE — Telephone Encounter (Signed)
Okay; thanks.

## 2018-07-23 NOTE — Telephone Encounter (Signed)
Patient is wondering about a prior auth needed for the ON/OFF testing? She siad she wasn't sure if she needed to do one or not for her insurance. I instructed to call ins co but would also ask you just incase you knew. Thanks!

## 2018-07-23 NOTE — Telephone Encounter (Signed)
I dont know the answer to this

## 2018-07-25 ENCOUNTER — Telehealth: Payer: Self-pay | Admitting: Neurology

## 2018-07-25 DIAGNOSIS — N3090 Cystitis, unspecified without hematuria: Secondary | ICD-10-CM | POA: Diagnosis not present

## 2018-07-25 DIAGNOSIS — N3000 Acute cystitis without hematuria: Secondary | ICD-10-CM | POA: Diagnosis not present

## 2018-07-25 NOTE — Telephone Encounter (Signed)
Patient is calling in wanting to speak with you about testing. Please call her back at [phone]. Thanks!

## 2018-07-25 NOTE — Telephone Encounter (Signed)
Spoke with patient and she wanted to know about coverage for ON/OFF test. I let her know it should just be an office visit charge. She will call with any other questions.

## 2018-08-01 ENCOUNTER — Encounter

## 2018-08-01 ENCOUNTER — Ambulatory Visit: Payer: PPO | Admitting: Neurology
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
## 2018-08-02 ENCOUNTER — Ambulatory Visit (INDEPENDENT_AMBULATORY_CARE_PROVIDER_SITE_OTHER): Payer: PPO | Admitting: Neurology

## 2018-08-02 ENCOUNTER — Encounter: Payer: Self-pay | Admitting: Neurology

## 2018-08-02 ENCOUNTER — Other Ambulatory Visit: Payer: Self-pay

## 2018-08-02 VITALS — BP 120/70 | HR 80 | Temp 98.4°F | Ht 61.0 in | Wt 140.0 lb

## 2018-08-02 DIAGNOSIS — G20 Parkinson's disease: Secondary | ICD-10-CM | POA: Diagnosis not present

## 2018-08-02 MED ORDER — CARBIDOPA-LEVODOPA ER 25-100 MG PO TBCR: EXTENDED_RELEASE_TABLET | ORAL | 1 refills | Status: DC

## 2018-08-02 MED ORDER — CARBIDOPA-LEVODOPA 25-100 MG PO TABS
3.0000 | ORAL_TABLET | Freq: Once | ORAL | Status: AC
  Administered 2018-08-02: 3 via ORAL

## 2018-08-02 NOTE — Patient Instructions (Addendum)
Start taking carbidopa/levodopa 25/100 2 at 9am/1 at 12:30pm/1 at 4pm/1 at 7:30pm for a week and then take carbidopa/levodopa 25/100 2 at 9am/1 at 12:30pm/2 at 4pm/1 at 7:30pm

## 2018-09-04 DIAGNOSIS — R300 Dysuria: Secondary | ICD-10-CM | POA: Diagnosis not present

## 2018-10-16 DIAGNOSIS — H43393 Other vitreous opacities, bilateral: Secondary | ICD-10-CM | POA: Diagnosis not present

## 2018-10-16 DIAGNOSIS — Z09 Encounter for follow-up examination after completed treatment for conditions other than malignant neoplasm: Secondary | ICD-10-CM | POA: Diagnosis not present

## 2018-10-16 DIAGNOSIS — H01021 Squamous blepharitis right upper eyelid: Secondary | ICD-10-CM | POA: Diagnosis not present

## 2018-10-16 DIAGNOSIS — Z961 Presence of intraocular lens: Secondary | ICD-10-CM | POA: Diagnosis not present

## 2018-11-06 DIAGNOSIS — N3001 Acute cystitis with hematuria: Secondary | ICD-10-CM | POA: Diagnosis not present
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
## 2019-01-02 ENCOUNTER — Ambulatory Visit (INDEPENDENT_AMBULATORY_CARE_PROVIDER_SITE_OTHER): Payer: PPO | Admitting: Neurology

## 2019-01-02 ENCOUNTER — Other Ambulatory Visit: Payer: Self-pay

## 2019-01-02 ENCOUNTER — Encounter: Payer: Self-pay | Admitting: Neurology

## 2019-01-02 VITALS — BP 127/67 | HR 78 | Ht 61.0 in | Wt 134.0 lb

## 2019-01-02 DIAGNOSIS — G20 Parkinson's disease: Secondary | ICD-10-CM | POA: Diagnosis not present

## 2019-01-02 MED ORDER — RYTARY 48.75-195 MG PO CPCR: 1.0000 | ORAL_CAPSULE | Freq: Four times a day (QID) | ORAL | 0 refills | Status: DC

## 2019-01-02 NOTE — Patient Instructions (Addendum)
-   Rytary 48.75MG /195 MG take 1 at 9AM/ 1 at 12:30PM/ 1 at 4PM/ 1 at 7:30 PM - Please call the office in 3 weeks to follow up on how you are doing with this medication. [phone]

## 2019-01-10 ENCOUNTER — Telehealth: Payer: Self-pay | Admitting: Neurology

## 2019-01-10 NOTE — Telephone Encounter (Signed)
Called patient she is taken 4 times a day. She will decrease to 3 days and call back in 2 weeks with update. She will contact eye doctor and monitor BP.

## 2019-01-10 NOTE — Telephone Encounter (Signed)
Patient is having issues with new medication-  Rytary. She is dizzy and nauseous and blurred vision/ dry eye. She said she has glaucoma. Please call her back and let her know what to do. Thanks!

## 2019-01-10 NOTE — Telephone Encounter (Signed)
I don't think that it would cause blurred vision/dry eye and she should call her eye doc.  She can try and decrease to three times per day (I think that she is on 4 times per day - confirm that) and see if she does better.  Have her check her BP as well.

## 2019-01-22 ENCOUNTER — Telehealth: Payer: Self-pay

## 2019-01-22 NOTE — Telephone Encounter (Signed)
Pt called back with follow up on change in medication.  Pt currently taken Rytary 48.75-195  She states that she still having blurred vision, unable to function. Rx is not working. She is requesting to go back on her other medication were she was taken  6 times a day. She requesting to increase it to 8 times a day.  Please advise

## 2019-01-23 MED ORDER — CARBIDOPA-LEVODOPA ER 25-100 MG PO TBCR: EXTENDED_RELEASE_TABLET | ORAL | 1 refills | Status: DC

## 2019-01-23 NOTE — Telephone Encounter (Signed)
It doesn't cause blurred vision in my experience but she can go back to the old one and increase if she wants.  We have proven during her on/off test that the IR works well but she doesn't tolerate that either.  For now, have her take: carbidopa/levodopa 25/100 CR 2 tablets at 9 AM, 2 tablet at 12:30 PM, 2 tablets at 4 PM and 2 tablet at 7 PM.  Send in a new RX to reflect this and make sure it is CR of the 25/100.  thanks

## 2019-01-23 NOTE — Telephone Encounter (Signed)
Called patient she was made aware of provider response below Rx sent to pharmacy with new instructions.

## 2019-02-11 DIAGNOSIS — H04123 Dry eye syndrome of bilateral lacrimal glands: Secondary | ICD-10-CM | POA: Diagnosis not present

## 2019-02-11 DIAGNOSIS — Z961 Presence of intraocular lens: Secondary | ICD-10-CM | POA: Diagnosis not present

## 2019-02-11 DIAGNOSIS — H26493 Other secondary cataract, bilateral: Secondary | ICD-10-CM | POA: Diagnosis not present

## 2019-02-19 DIAGNOSIS — R05 Cough: Secondary | ICD-10-CM | POA: Diagnosis not present

## 2019-02-19 DIAGNOSIS — N3090 Cystitis, unspecified without hematuria: Secondary | ICD-10-CM | POA: Diagnosis not present

## 2019-02-19 DIAGNOSIS — J069 Acute upper respiratory infection, unspecified: Secondary | ICD-10-CM | POA: Diagnosis not present

## 2019-02-19 DIAGNOSIS — N3000 Acute cystitis without hematuria: Secondary | ICD-10-CM | POA: Diagnosis not present

## 2019-04-08 DIAGNOSIS — G20 Parkinson's disease: Secondary | ICD-10-CM | POA: Diagnosis not present

## 2019-04-08 DIAGNOSIS — R1084 Generalized abdominal pain: Secondary | ICD-10-CM | POA: Diagnosis not present

## 2019-04-08 DIAGNOSIS — I739 Peripheral vascular disease, unspecified: Secondary | ICD-10-CM | POA: Diagnosis not present

## 2019-04-08 DIAGNOSIS — R0789 Other chest pain: Secondary | ICD-10-CM | POA: Diagnosis not present

## 2019-04-08 DIAGNOSIS — R399 Unspecified symptoms and signs involving the genitourinary system: Secondary | ICD-10-CM | POA: Diagnosis not present

## 2019-04-08 DIAGNOSIS — R5383 Other fatigue: Secondary | ICD-10-CM | POA: Diagnosis not present

## 2019-04-08 DIAGNOSIS — R5381 Other malaise: Secondary | ICD-10-CM | POA: Diagnosis not present

## 2019-04-09 DIAGNOSIS — R1084 Generalized abdominal pain: Secondary | ICD-10-CM | POA: Diagnosis not present

## 2019-04-09 DIAGNOSIS — R109 Unspecified abdominal pain: Secondary | ICD-10-CM | POA: Diagnosis not present

## 2019-04-09 DIAGNOSIS — R5381 Other malaise: Secondary | ICD-10-CM | POA: Diagnosis not present

## 2019-04-09 DIAGNOSIS — S299XXA Unspecified injury of thorax, initial encounter: Secondary | ICD-10-CM | POA: Diagnosis not present

## 2019-04-09 DIAGNOSIS — K5900 Constipation, unspecified: Secondary | ICD-10-CM | POA: Diagnosis not present

## 2019-04-09 DIAGNOSIS — R5383 Other fatigue: Secondary | ICD-10-CM | POA: Diagnosis not present

## 2019-04-09 DIAGNOSIS — R0789 Other chest pain: Secondary | ICD-10-CM | POA: Diagnosis not present

## 2019-04-22 DIAGNOSIS — R209 Unspecified disturbances of skin sensation: Secondary | ICD-10-CM | POA: Diagnosis not present

## 2019-04-22 DIAGNOSIS — I739 Peripheral vascular disease, unspecified: Secondary | ICD-10-CM | POA: Diagnosis not present

## 2019-05-12 ENCOUNTER — Encounter: Payer: Self-pay | Admitting: Neurology

## 2019-06-09 NOTE — Progress Notes (Signed)
Pt schedule/confirmed VV today.  Called and cancelled day of VV.  R/s.

## 2019-06-10 ENCOUNTER — Telehealth (INDEPENDENT_AMBULATORY_CARE_PROVIDER_SITE_OTHER): Payer: PPO | Admitting: Neurology

## 2019-06-10 ENCOUNTER — Other Ambulatory Visit: Payer: Self-pay

## 2019-06-10 DIAGNOSIS — N3001 Acute cystitis with hematuria: Secondary | ICD-10-CM | POA: Diagnosis not present

## 2019-06-10 DIAGNOSIS — J0100 Acute maxillary sinusitis, unspecified: Secondary | ICD-10-CM | POA: Diagnosis not present

## 2019-06-10 DIAGNOSIS — K219 Gastro-esophageal reflux disease without esophagitis: Secondary | ICD-10-CM | POA: Diagnosis not present

## 2019-06-25 IMAGING — CT CT HEAD W/O CM
4 series · 16 of 47 positions shown, 18 images · non-contrast
Comparison: 03/31/2015 CT head

CLINICAL DATA: 72 y/o F; fall 12/27/2017. Injury to left ear and
left-sided head. Continued weakness on the left side.

EXAM:
CT HEAD WITHOUT CONTRAST
TECHNIQUE: Contiguous axial images were obtained from the base of the skull
through the vertex without intravenous contrast.

[Series 2: head 5.00 hr40 s3 ibhc · axial · 0.35mm/px · z∈[-490,-385]mm · 7 of 29 slices shown, 9 images]
[im 4/29  brain]
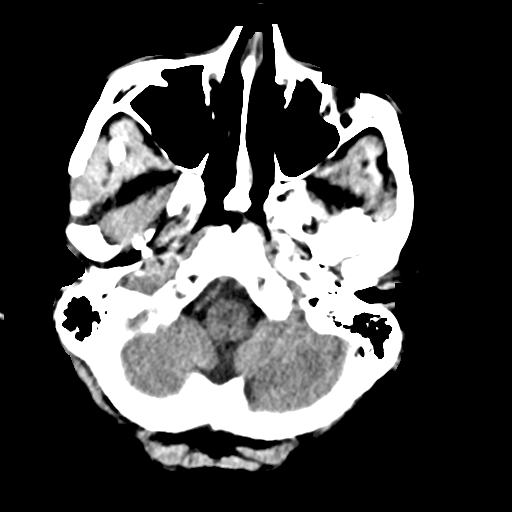
[im 4/29  bone]
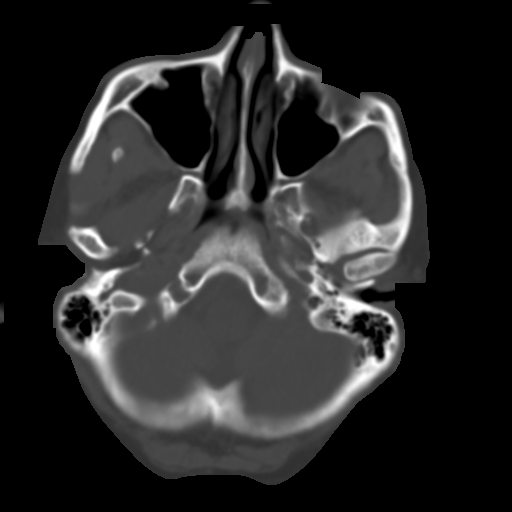
[im 8/29  brain]
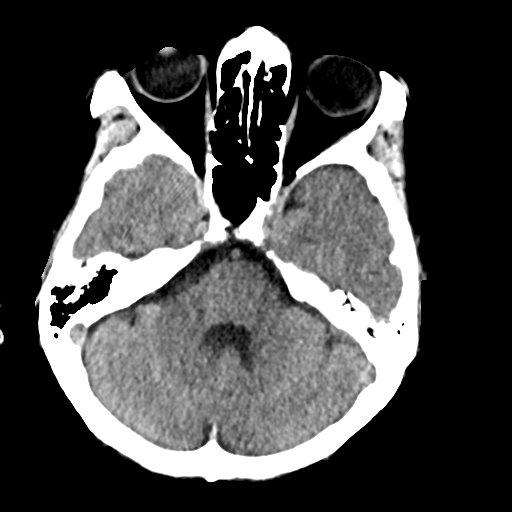
[im 11/29  brain]
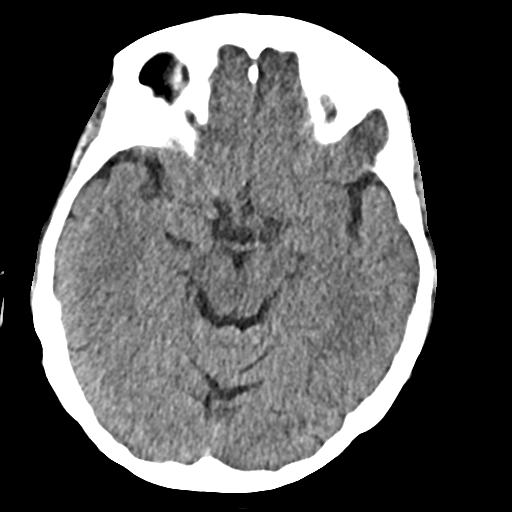
[im 15/29  brain]
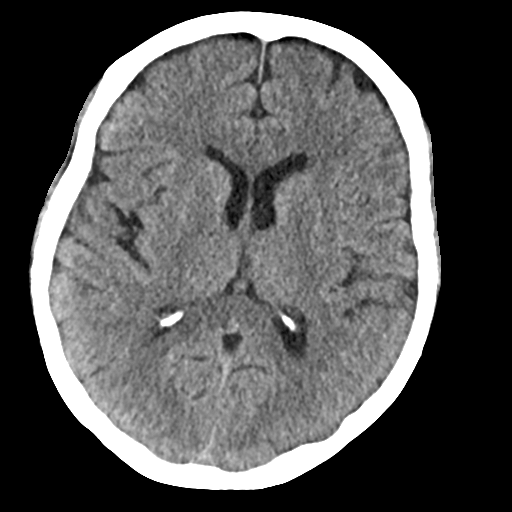
[im 18/29  brain]
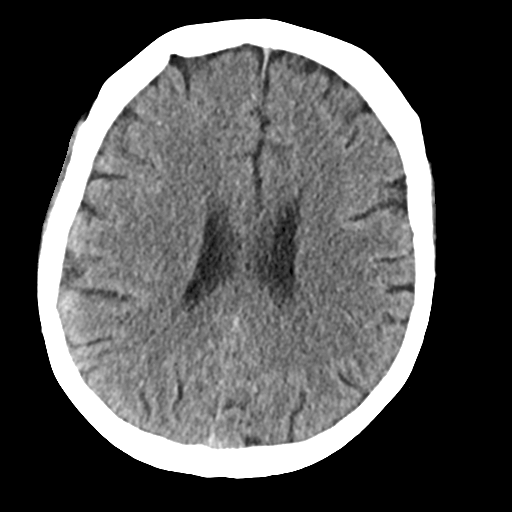
[im 18/29  bone]
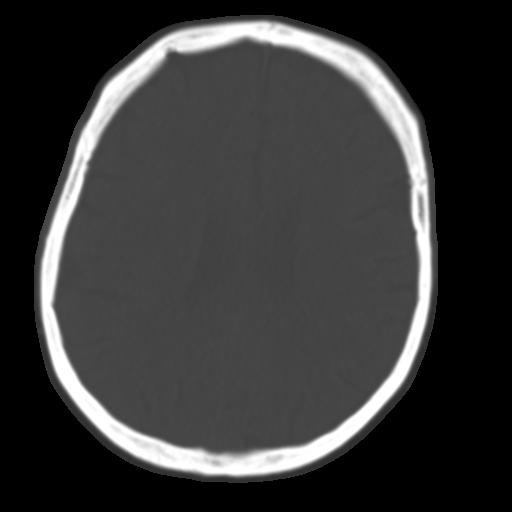
[im 22/29  brain]
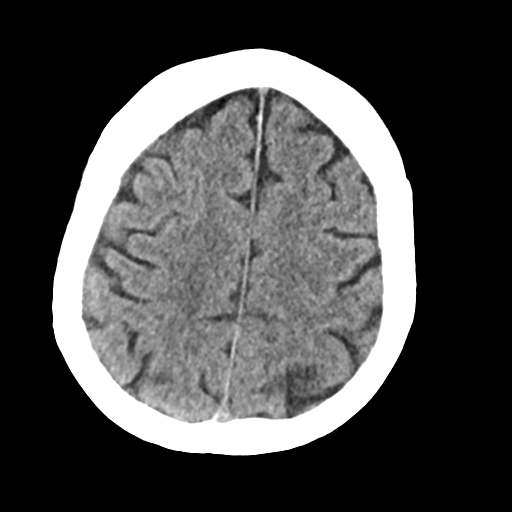
[im 25/29  brain]
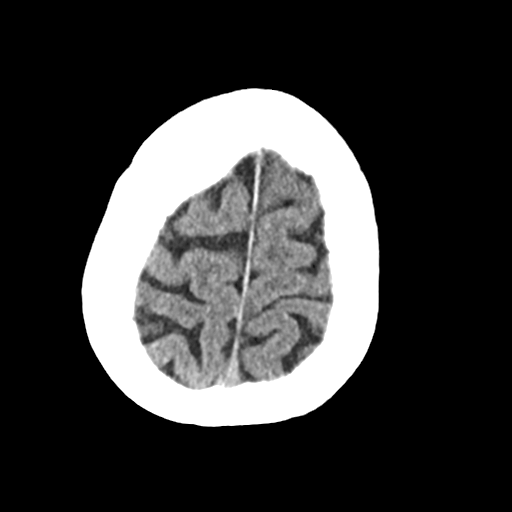

[Series 3: head 2.00 hr60 s3 bone · axial · 0.36mm/px · z∈[-493,-465]mm · 3 of 72 slices shown]
[im 8/72  bone]
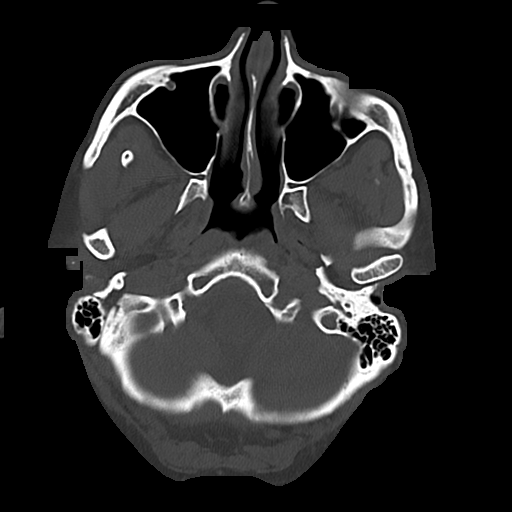
[im 15/72  bone]
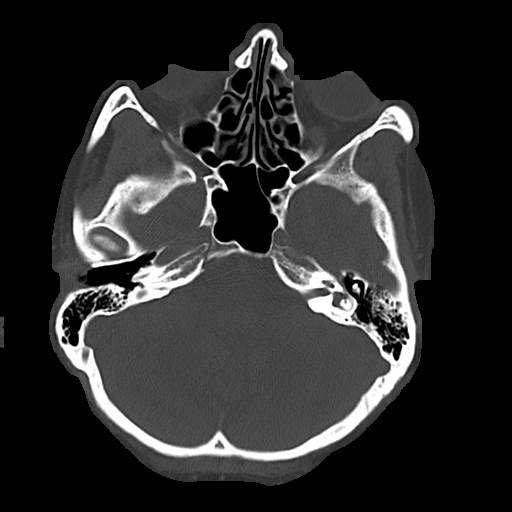
[im 22/72  bone]
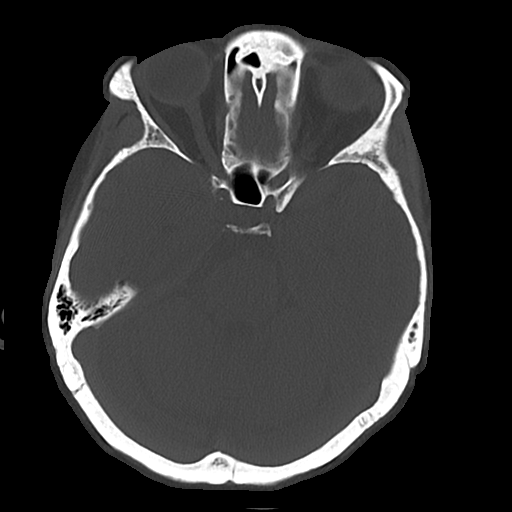

[Series 4: head 3.00 hr40 s3 sag · sagittal · 0.28mm/px · 3 of 81 slices shown]
[im 27/81  brain]
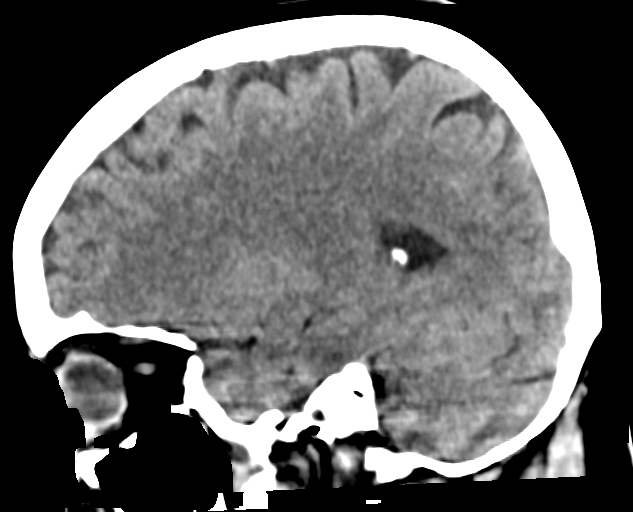
[im 41/81  brain]
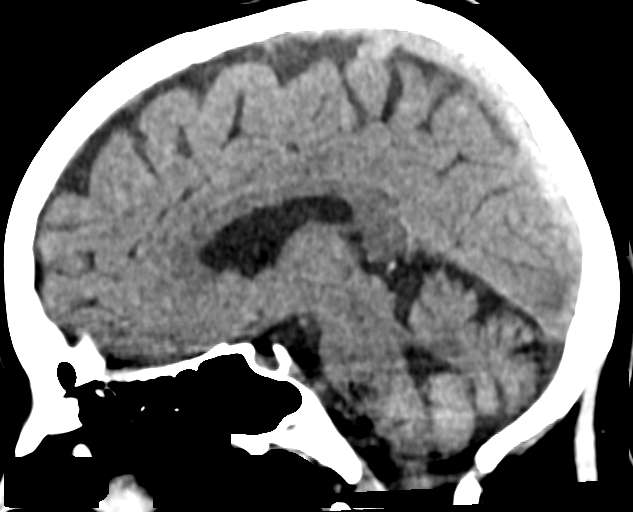
[im 54/81  brain]
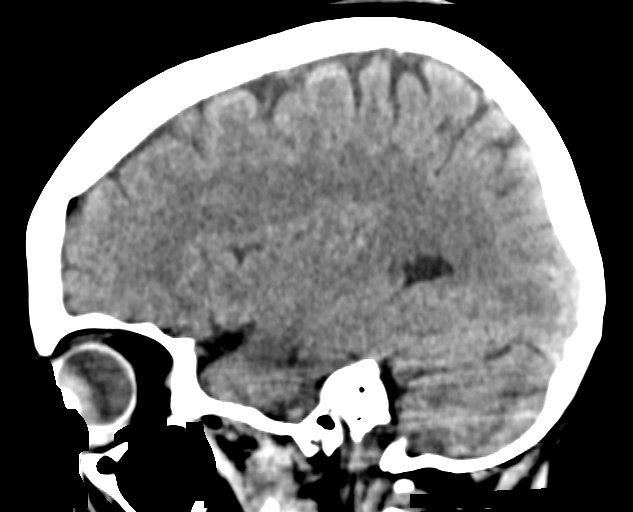

[Series 6: head 3.00 hr40 s3 cor · coronal · 0.28mm/px · 3 of 88 slices shown]
[im 30/88  brain]
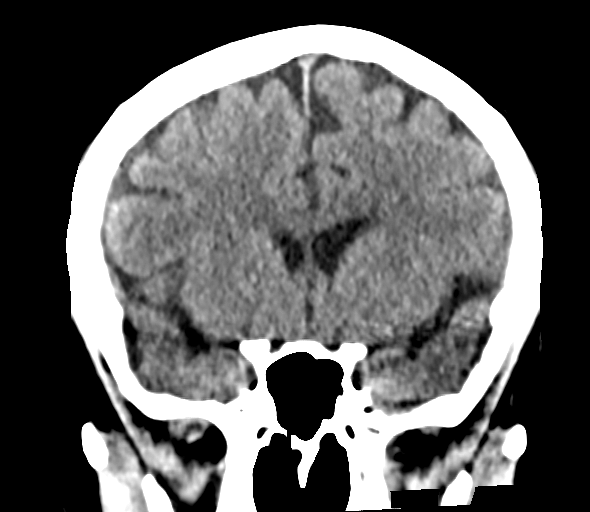
[im 39/88  brain]
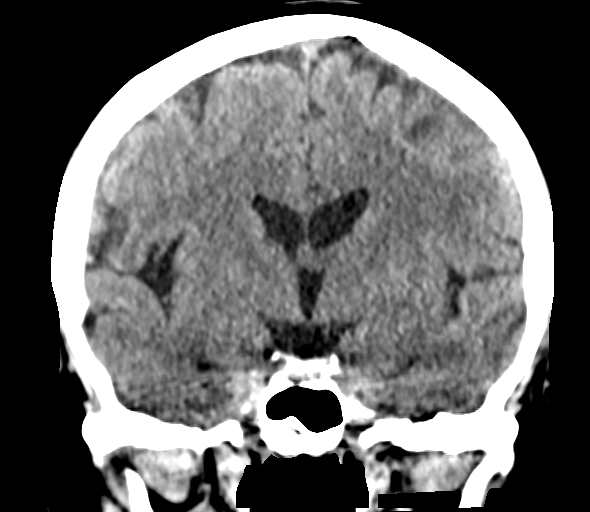
[im 49/88  brain]
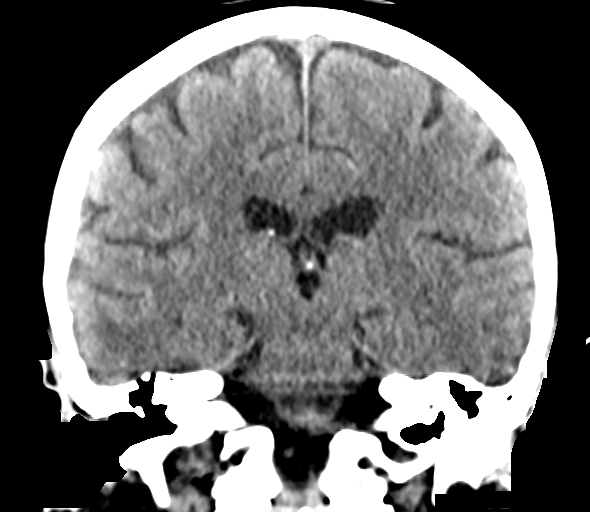

[16 of 47 positions shown; findings below may reference images not displayed]

FINDINGS: Brain: No evidence of acute infarction, hemorrhage, hydrocephalus,
extra-axial collection or mass lesion/mass effect. Mild chronic
microvascular ischemic changes and volume loss of the brain.

Vascular: Calcific atherosclerosis of carotid siphons. No hyperdense
vessel.

Skull: Normal. Negative for fracture or focal lesion.

Sinuses/Orbits: No acute finding.

Other: None.
IMPRESSION: No acute intracranial abnormality identified. Mild chronic
microvascular ischemic changes and volume loss of the brain.

By: Paulus N Ceejay M.D.

## 2019-07-04 DIAGNOSIS — N3000 Acute cystitis without hematuria: Secondary | ICD-10-CM | POA: Diagnosis not present

## 2019-07-07 NOTE — Progress Notes (Signed)
Virtual Visit via Phone Note (tried via video but pt unable to connect) The purpose of this virtual visit is to provide medical care while limiting exposure to the novel coronavirus.    Consent was obtained for phone visit:  Yes.   Answered questions that patient had about telephone interaction:  Yes.   I discussed the limitations, risks, security and privacy concerns of performing an evaluation and management service by telemedicine. I also discussed with the patient that there may be a patient responsible charge related to this service. The patient expressed understanding and agreed to proceed.  Pt location: Home Physician Location: office Name of referring provider:  Myrlene Broker, MD I connected with Alexis Douglas at patients initiation/request on 07/08/2019 at  3:00 PM EST by telephone and verified that I am speaking with the correct person using two identifiers. Pt MRN:  [id number] Pt DOB:  [birth year] Video Participants:  Alexis Douglas;     History of Present Illness:  Patient seen today in follow-up for Parkinson's disease.  I tried to switch her to Rodman last visit, but the patient felt that it caused a dry eye.  I did not think that it was associated with dry eye, but the patient wanted to switch back to levodopa.  We had previously done and on/off test and demonstrated very good efficacy with levodopa IR, but she did not tolerate it.  Therefore, as we switched back to the CR version.  She ended up taking carbidopa/levodopa 25/100 CR, 2/1/2/1/2.  She states that if she took it all as 2 tablets, then she felt doped up.  She has had 2 falls.  The one was thanksgiving week and the other Xmas week.  The one she was cooking in the kitchen and she turned quick and fell into the door of the refrigerator.  The other one was in the bedroom and she fell back and hit her head.  No LOC.  Current movement d/o meds:  carbidopa/levodopa 25/100 CR 2 tablets at 9 AM, 2 tablet at 12:30 PM,  2 tablets at 4 PM and 2 tablet at 7 PM   Previous medication: Pramipexole (headache), carbidopa/levodopa immediate release (itching/nausea after one quarter of a tablet), carbidopa/levodopa extended release (dry eyes, swelling in the feet and toes, dizzy) but went back on it and was able to tolerate; rytary (felt caused dry eye)   Current Outpatient Medications on File Prior to Visit  Medication Sig Dispense Refill  . Carbidopa-Levodopa ER (SINEMET CR) 25-100 MG tablet controlled release 2 at 9am/2 at 12:30pm/2 at 4pm/2 at 7:30pm 672 tablet 1  . dexlansoprazole (DEXILANT) 60 MG capsule Take 60 mg by mouth daily.    Marland Kitchen estradiol (ESTRACE) 2 MG tablet Take 2 mg by mouth daily.    Marland Kitchen linaclotide (LINZESS) 72 MCG capsule Take 72 mcg by mouth daily.     No current facility-administered medications on file prior to visit.     Observations/Objective:   There were no vitals filed for this visit. Speech is dysarthric.  She is alert and oriented x 3    Assessment and Plan:   1.  Parkinsons Disease  -Biggest issue is multiple intolerances to medications, and willingness to want to do therapy (many barriers to care).  If proven through levodopa challenge that the disease responded very well to the immediate release levodopa, although she did not feel well when she was given it.  -For now she will continue carbidopa/levodopa 25/100 CR, 2/1/2/1/2  -needs  PT/ST.  States that she cannot get out to therapy.  Doesn't want to do home therapy.  Worries about having others in the home  -discussed covid vaccine.  Would recommend it.  She is worried about taking it.  Told her recommended in Parkinsons Disease   -information about U step walker put in mail for patient. 2.  Bladder incontinence  -Intolerances to medications here have also been an issue.  Would encourage her to follow-up with urology.  Follow Up Instructions:  told her wanted to see her in office next visit (she wanted VV).    -I discussed the  assessment and treatment plan with the patient. The patient was provided an opportunity to ask questions and all were answered. The patient agreed with the plan and demonstrated an understanding of the instructions.   The patient was advised to call back or seek an in-person evaluation if the symptoms worsen or if the condition fails to improve as anticipated.    Total time spent on today's visit was 15 minutes, including both face-to-face time and nonface-to-face time.  Time included that spent on review of records (prior notes available to me/labs/imaging if pertinent), discussing treatment and goals, answering patient's questions and coordinating care.   Alonza Bogus, DO

## 2019-07-08 ENCOUNTER — Encounter: Payer: Self-pay | Admitting: Neurology

## 2019-07-08 ENCOUNTER — Other Ambulatory Visit: Payer: Self-pay

## 2019-07-08 ENCOUNTER — Telehealth (INDEPENDENT_AMBULATORY_CARE_PROVIDER_SITE_OTHER): Payer: PPO | Admitting: Neurology

## 2019-07-08 VITALS — Ht 61.0 in | Wt 123.0 lb

## 2019-07-08 DIAGNOSIS — G20 Parkinson's disease: Secondary | ICD-10-CM | POA: Diagnosis not present

## 2019-07-23 DIAGNOSIS — R300 Dysuria: Secondary | ICD-10-CM | POA: Diagnosis not present

## 2019-08-11 ENCOUNTER — Other Ambulatory Visit: Payer: Self-pay | Admitting: Neurology

## 2019-09-26 DIAGNOSIS — R300 Dysuria: Secondary | ICD-10-CM | POA: Diagnosis not present

## 2019-10-22 DIAGNOSIS — R300 Dysuria: Secondary | ICD-10-CM | POA: Diagnosis not present

## 2019-10-31 ENCOUNTER — Ambulatory Visit: Payer: PPO | Admitting: Neurology

## 2019-12-12 ENCOUNTER — Telehealth: Payer: Self-pay | Admitting: Neurology

## 2019-12-12 NOTE — Telephone Encounter (Signed)
Patient called in and left a message with the Access Nurse. She would like to know what kind of sinus medication she can take that will not interfere with her Parkinson's medication. She stated she is having trouble breathing.

## 2019-12-13 NOTE — Telephone Encounter (Signed)
I have an appt with her this week and we can discuss but OTC sinus meds are okay with carbidopa/levodopa.

## 2019-12-15 DIAGNOSIS — R300 Dysuria: Secondary | ICD-10-CM | POA: Diagnosis not present

## 2019-12-15 DIAGNOSIS — G20 Parkinson's disease: Secondary | ICD-10-CM | POA: Diagnosis not present

## 2019-12-15 DIAGNOSIS — R0981 Nasal congestion: Secondary | ICD-10-CM | POA: Diagnosis not present

## 2019-12-15 DIAGNOSIS — K2900 Acute gastritis without bleeding: Secondary | ICD-10-CM | POA: Diagnosis not present

## 2019-12-15 NOTE — Telephone Encounter (Signed)
Left message on home and cell phone advising patient to contact the office. No dpr in chart no detailed message left on patients voicemail.

## 2019-12-16 NOTE — Telephone Encounter (Addendum)
Patient has been notified directly; all questions, if any, were answered. Patient voiced understanding.   Patient states she is not feeling well and does not want to come in the office. She requested to change her appt to virtual.   Ok per Dr Tat. Appointment changed.

## 2019-12-16 NOTE — Telephone Encounter (Signed)
Received voicemail from patient stating she got my message and she does not know why I am calling her but to give her a callback at [phone].

## 2019-12-16 NOTE — Progress Notes (Signed)
Virtual Visit Via Video  Had to change to phone visit after 15 min of attempting to help patient get onto video platform The purpose of this virtual visit is to provide medical care while limiting exposure to the novel coronavirus.    Consent was obtained for phone visit:  Yes.   Answered questions that patient had about telephone interaction:  Yes.   I discussed the limitations, risks, security and privacy concerns of performing an evaluation and management service by telephone. I also discussed with the patient that there may be a patient responsible charge related to this service. The patient expressed understanding and agreed to proceed.  Pt location: Home Physician Location: office Name of referring provider:  Myrlene Broker, MD I connected with Alexis Douglas at patients initiation/request on 12/18/2019 at  3:30 PM EDT by telephone and verified that I am speaking with the correct person using two identifiers. Pt MRN:  [id number] Pt DOB:  August 31, 1945 Video Participants:  Alexis Douglas;    Assessment/Plan:   1.  Parkinsons Disease             -Biggest issue is multiple intolerances to medications, and willingness to want to do therapy (many barriers to care).    We have done a levodopa challenge test and proved that the patient responded very well to levodopa, although she stated that she did not feel well on the immediate release.             -For now she will continue carbidopa/levodopa 25/100CR, 2/1/2/1/2             -Declines physical and ST initially but then states that she will "talk to them."  I have had issues with HTA approving PT/ST and will find out who we can use.  Pt home bound and doesn't leave house  -encouraged covid vaccine (she has not had it yet)             -discussed with patient that I really would like to see her at an in person visit.  She has had several in person visit scheduled that she either canceled or moved to telehealth.  She was not even able  to get on the video platform today, so was only able to do a phone visit.  Have not seen her at an in person visit for a year. 2.  Bladder incontinence             -Intolerances to medications here have also been an issue.  Would encourage her to follow-up with urology and discussed that again today.  She did just see primary care in regards to dysuria on August 2.  Subjective   Patient seen today in follow-up for Parkinson's disease.  Patient originally scheduled for in person visit, but change it to video visit.  Patient reports that she was not feeling well.  Medical records have been reviewed.  She has seen primary care multiple times since our last visit regarding dysuria.  She just saw them on August 2, but her UA was negative at that point in time.  "every month I get one."  Hasn't been back to urology.  Thinks that the incontinence at night and laying wet in the depends all night contributes to the infections.  She thinks that walking is getting worse.  No falls since christmas.  Thinks that her thinking is not good with her meds.    Current movement d/o meds:  Carbidopa/levodopa 25/100 CR, 2/1/2/1/2  Prior meds:Previous medication: Pramipexole (headache), carbidopa/levodopa immediate release (itching/nausea after one quarter of a tablet), carbidopa/levodopa extended release (dry eyes, swelling in the feet and toes, dizzy) but went back on it and was able to tolerate; rytary (felt caused dry eye)   Current Outpatient Medications on File Prior to Visit  Medication Sig Dispense Refill  . Carbidopa-Levodopa ER (SINEMET CR) 25-100 MG tablet controlled release TAKE 2 TABLETS BY MOUTH AT 9AM, 2 TABLETS AT 12.30PM, 2 TABLETS AT 4PM, AND 2 TABLETS AT 7.30PM (Patient taking differently: TAKE 2 TABLETS BY MOUTH AT 9AM, 1 TABLETS AT 12.30PM, 2 TABLETS AT 4PM, AND 1 TABLETS AT 7.30PM) 672 tablet 1  . dexlansoprazole (DEXILANT) 60 MG capsule Take 60 mg by mouth daily.    Marland Kitchen dicyclomine (BENTYL) 10 MG  capsule Take 10 mg by mouth every 6 (six) hours as needed.    . doxycycline (VIBRAMYCIN) 100 MG capsule Take 1 capsule by mouth in the morning and at bedtime. For 10 days    . estradiol (ESTRACE) 2 MG tablet Take 2 mg by mouth daily.    Marland Kitchen linaclotide (LINZESS) 72 MCG capsule Take 72 mcg by mouth daily.    . methocarbamol (ROBAXIN) 500 MG tablet Take 500 mg by mouth as needed for muscle spasms.    . sucralfate (CARAFATE) 1 g tablet Take 1 tablet by mouth in the morning, at noon, in the evening, and at bedtime.     No current facility-administered medications on file prior to visit.     Objective   Vitals:   12/17/19 1644  Weight: 122 lb (55.3 kg)  Height: 5\' 1"  (1.549 m)   GEN:  The patient appears stated age and is in NAD. The speech is dysarthric.    Follow up Instructions      -I discussed the assessment and treatment plan with the patient. The patient was provided an opportunity to ask questions and all were answered. The patient agreed with the plan and demonstrated an understanding of the instructions.   The patient was advised to call back or seek an in-person evaluation if the symptoms worsen or if the condition fails to improve as anticipated.    Total time spent on today's visit was 15 minutes, including both face-to-face time and nonface-to-face time.  Time included that spent on review of records (prior notes available to me/labs/imaging if pertinent), discussing treatment and goals, answering patient's questions and coordinating care.   Alonza Bogus, DO

## 2019-12-17 ENCOUNTER — Encounter: Payer: Self-pay | Admitting: Neurology

## 2019-12-18 ENCOUNTER — Other Ambulatory Visit: Payer: Self-pay

## 2019-12-18 ENCOUNTER — Telehealth (INDEPENDENT_AMBULATORY_CARE_PROVIDER_SITE_OTHER): Payer: PPO | Admitting: Neurology

## 2019-12-18 VITALS — Ht 61.0 in | Wt 122.0 lb

## 2019-12-18 DIAGNOSIS — G20 Parkinson's disease: Secondary | ICD-10-CM

## 2019-12-26 ENCOUNTER — Telehealth: Payer: Self-pay | Admitting: Neurology

## 2019-12-26 DIAGNOSIS — G20 Parkinson's disease: Secondary | ICD-10-CM

## 2019-12-26 NOTE — Telephone Encounter (Signed)
Spoke with patient who states she has found a place she wants to use for speech and physical therapy because she heard the place was good. She stated she does not want to [facility] at home.   [facility] patient is requesting [facility] for her therapist. Phone: [phone].   Patient states she is having issues with her feet and swallowing. Advised patient that therapy will help with this.

## 2019-12-26 NOTE — Telephone Encounter (Signed)
Alexis Douglas will call patient back

## 2019-12-26 NOTE — Telephone Encounter (Signed)
You can try sending to [facility] but her insurance will likely only approve [facility].  I told her that when she was here.  HTA gives [place] a lot of troubles when it comes to paying for home PT and no one wants to take it.

## 2019-12-29 NOTE — Telephone Encounter (Signed)
Spoke with patient and informed her that [facility] may mot accept her insurance. She states she does not want to use [facility]. Tried to explain to patient her insurance may not accept her insurance but she requested the referral for physical therapy to be sent to [facility].

## 2019-12-30 NOTE — Telephone Encounter (Signed)
Received call from [facility] from [place] stating they appreciate the referral but they do not accept the patients insurance.   Left detailed message for patient informing her that [facility] does not accept her insurance. Advised her to contact office with any questions or concerns.

## 2019-12-31 DIAGNOSIS — J40 Bronchitis, not specified as acute or chronic: Secondary | ICD-10-CM | POA: Diagnosis not present

## 2019-12-31 DIAGNOSIS — J0100 Acute maxillary sinusitis, unspecified: Secondary | ICD-10-CM | POA: Diagnosis not present

## 2020-01-15 ENCOUNTER — Telehealth: Payer: Self-pay | Admitting: Neurology

## 2020-01-15 NOTE — Telephone Encounter (Signed)
Her insurance limits home PT to certain therapy groups but she has declined those.  She can GO to lots of places for PT but has declined that as well.  Not sure how to help her if she doesn't want to do PT You can put her on cx list F/u pcp re: pain

## 2020-01-15 NOTE — Telephone Encounter (Signed)
Patient's spouse called with concerns that have come up since the last virtual visit on 12/18/19. He further explained there has been a status change, "She has had bronchitis since then but has gone downhill since her last visit and is in a wheelchair now. She has no strength in her arms and legs and is in a lot of pain. I just wanted Dr. Carles Collet to know how quickly this has happened and that she needs some help."  [facility] in [place]

## 2020-01-15 NOTE — Telephone Encounter (Signed)
Did she ever do the PT we recommended She has declined in person visits for a year (see last note).  Biggest barriers to her good care are her declining care (therapies, exercise, leaving house to be seen in person) Parkinsons Disease doesn't cause leg pain so make sure that f/u with PCP

## 2020-01-15 NOTE — Telephone Encounter (Signed)
Spoke with patient and she states she is willing to do therapy but her insurance will not let her go where she wants to go.   Patient states she does not understand why she cant use her arms pr hands. She can not do anything. She can not walk.   Patient states she has not reached out to her pcp about her pain. Last saw her pcp on 12/31/2019.   Patient is requesting an appt to be seen in office. Patient states she can not sleep at night because she is in pain.   Advised patient multiples times to reach out to her pcp for pain.

## 2020-01-16 NOTE — Telephone Encounter (Signed)
Pt has been added to wait list.

## 2020-01-16 NOTE — Telephone Encounter (Signed)
Spoke with patients spouse, Sonia Side, per DPR, and gave him Dr Doristine Devoid recommendations. He voiced understanding.

## 2020-02-13 ENCOUNTER — Telehealth: Payer: Self-pay | Admitting: Neurology

## 2020-02-13 DIAGNOSIS — G20 Parkinson's disease: Secondary | ICD-10-CM

## 2020-02-13 NOTE — Telephone Encounter (Signed)
Patient states she wants to try [facility] in [place] for physical therapy.   She is she is willing to pay out of pocket for services. Advised patient that I will call and see if they accept her insurance and if they don't I will have them give her a call.   Patient voiced understanding.

## 2020-02-13 NOTE — Telephone Encounter (Signed)
Lots of OUTpatient groups do accept HTA.  She wanted HOME PT!  Yes, okay for [facility]

## 2020-02-13 NOTE — Telephone Encounter (Signed)
Spoke with [facility] and was informed that they do accept [employer].   Is it ok to send pt referral?

## 2020-02-16 ENCOUNTER — Telehealth: Payer: Self-pay | Admitting: Neurology

## 2020-02-16 DIAGNOSIS — G20 Parkinson's disease: Secondary | ICD-10-CM

## 2020-02-16 NOTE — Telephone Encounter (Signed)
Spoke with patient who states we need to send over a home health for her to do at home PT. She also wants a referral for speech. Patient states Dr Tat recommended she have speech therapy.   Is it ok to change the referral?

## 2020-02-16 NOTE — Telephone Encounter (Signed)
Thats what I thought as well but when I spoke to her she requested I send a referral for home PT.   she also wants a referral for speech therapy.

## 2020-02-16 NOTE — Telephone Encounter (Signed)
Patient states she thought when she said physical therapy that Dr Tat would know she meant home PT. Informed patient that we are unaware of what she needs unless she tells [place]. Patient states Dr Tat is the one who suggested therapy so she thought she would know what she meant. Informed patient that she must let us know how to help her so we can provide good care. She voiced understanding.  Orders placed.

## 2020-02-16 NOTE — Telephone Encounter (Signed)
Patient notified and voiced understanding.   Referral faxed over to [facility].

## 2020-02-16 NOTE — Telephone Encounter (Signed)
See note from last week.  I thought she was refusing home PT and was now wanting out patient PT.  Cannot do both

## 2020-02-16 NOTE — Telephone Encounter (Signed)
Patient states that she needs to speak to a Nurse about her PT referral. She states that we sent in the wrong kind of referral we were needing to send in a referral for Home PT

## 2020-02-16 NOTE — Telephone Encounter (Signed)
Please let pt know that we cannot keep changing orders.  One requires we say she is homebound and one not.  Its also staff time intensive.  I want to do what is best for her but this has been going on a long time

## 2020-02-20 NOTE — Progress Notes (Signed)
Assessment/Plan:   1.  Parkinsons Disease -Biggest issue is multiple intolerances to medications, and willingness to participate with exercise/therapies.   We have done a levodopa challenge test and proved that the patient responded very well to levodopa, although she stated that she did not feel well on the immediate release. -For now she will increase levodopa and changed dosing schedule to the following:Take carbidopa/levodopa 25/100 CR, 2 tablets at 8am, 2 tablets at 11am, 2 tablets at 2 pm, 2 tablets at 5pm.  -Add carbidopa/levodopa 50/200 CR at bedtime -have tried multiple times since last visit (sent numerous referrals) to home PT but patient only wants certain therapy groups that do not take her health insurance.  Her health insurance is very specific and only 1 group takes it.  Her health insurance will allow her to get outpatient physical therapy out of the home, but she declines that.  She states that [facility] has not called her re: PT yet.  I told her to call [facility], where she wanted the therapy order sent, as she states that she has not yet heard from them. She has very profound speech deficit, but [facility] does not do speech therapy, and this is where she requested therapy.             -encouraged covid vaccine (she has not had it yet and discussed importance at prior visits as well)  -encouraged flu vaccine and we discussed this today.  2. Bladder incontinence -Intolerances to medications here have also been an issue. Would encourage her to follow-up with urology and discussed that again today.  She did just see primary care in regards to dysuria on August 2.  3.  Hand pain  -f/u pcp.  Unable to tolerate nsaids.  States that this is the biggest limiting factor right now. States that she has trouble eating because of stiffness and pain in the hands.  4. Patient already has an appointment in February and  she will keep that. Subjective:   Alexis Douglas was seen today in follow up for Parkinsons disease.  My previous records were reviewed prior to todays visit as well as outside records available to me. Husband is present in the waiting room, but does not wish to come and attend the visit. She was worked in today.  I have not seen her in person visit for over a year and the last visit was just a phone visit.  Somewhat reluctantly, she agreed to physical therapy after last visit, but then did not like the physical therapy group (they did not come to the home, but did not like the one where the order was sent).  This was the only group that her insurance would approve.  Legs are cramping at bedtime.  She is having trouble sleeping at bed.  No falls.  She walks on the walker.  She has stationary pedals that she uses for exercise.  She woke up about 6 weeks ago with not being able to use either hands.  Went to PCP and given anti-inflammatory but "I stopped it because of side effects - it made me swell up."  Having neck pain.  There is pain in both hands. States that this is the biggest problem she is having because it limits ability to eat and close her hand around the utensil.  Current prescribed movement disorder medications: carbidopa/levodopa 25/100CR, 2 at 8/1 at noon/2 at 4pm/1at 8pm/2at 11-12pm (this is the way patient is dosing  it)   Prior meds:Previous medication: Pramipexole (headache), carbidopa/levodopa immediate release (itching/nausea after one quarter of a tablet but was able to tolerate it during levodopa challenge and we proved efficacy but pt declined RX after that), carbidopa/levodopa extended release (dry eyes, swelling in the feet and toes, dizzy but went back on it and was able to tolerate); rytary (felt caused dry eye)  ALLERGIES:   Allergies  Allergen Reactions  . Cyclobenzaprine Nausea Only  . Metronidazole Other (See Comments)    Unknown  . Mirapex [Pramipexole  Dihydrochloride] Other (See Comments)    HA  . Moxifloxacin   . Sulfamethoxazole Itching  . Codeine Other (See Comments)    nervous  . Nitrofurantoin Other (See Comments)    Could not tolerate  . Nitrofurantoin Macrocrystal Rash  . Prochlorperazine Other (See Comments)    hyper  . Sulfa Antibiotics Itching and Rash  . Sulfamethoxazole-Trimethoprim Rash  . Tramadol Other (See Comments)    Deep sleep    CURRENT MEDICATIONS:  Outpatient Encounter Medications as of 02/24/2020  Medication Sig  . Carbidopa-Levodopa ER (SINEMET CR) 25-100 MG tablet controlled release TAKE 2 TABLETS BY MOUTH AT 9AM, 2 TABLETS AT 12.30PM, 2 TABLETS AT 4PM, AND 2 TABLETS AT 7.30PM (Patient taking differently: TAKE 2 TABLETS BY MOUTH AT 8AM, 1 TABLETS AT 12PM, 2 TABLETS AT 4PM, AND 1 TABLETS AT 7:30PM and 1 at 11)  . dexlansoprazole (DEXILANT) 60 MG capsule Take 60 mg by mouth daily.  Marland Kitchen dicyclomine (BENTYL) 10 MG capsule Take 10 mg by mouth every 6 (six) hours as needed.  Marland Kitchen estradiol (ESTRACE) 2 MG tablet Take 2 mg by mouth daily.  Marland Kitchen linaclotide (LINZESS) 72 MCG capsule Take 72 mcg by mouth daily.  . methocarbamol (ROBAXIN) 500 MG tablet Take 500 mg by mouth as needed for muscle spasms.  . sucralfate (CARAFATE) 1 g tablet Take 1 tablet by mouth in the morning, at noon, in the evening, and at bedtime. Takes prn   No facility-administered encounter medications on file as of 02/24/2020.    Objective:   PHYSICAL EXAMINATION:    VITALS:   Vitals:   02/24/20 1540  BP: 113/71  Pulse: 82  SpO2: 97%  Height: 5\' 1"  (1.549 m)    GEN:  The patient appears stated age and is in NAD. HEENT:  Normocephalic, atraumatic.  The mucous membranes are moist. The superficial temporal arteries are without ropiness or tenderness. CV:  RRR Lungs:  CTAB Neck/HEME:  There are no carotid bruits bilaterally.  Neurological examination:  Orientation: The patient is alert and oriented x3. Cranial nerves: There is good facial  symmetry with facial hypomimia. The speech is fluent and quite dysarthric. Soft palate rises symmetrically and there is no tongue deviation. Hearing is intact to conversational tone. Sensation: Sensation is intact to light touch throughout Motor: Strength is 5/5 in the bilateral upper and lower extremities. Her grip strength was actually quite good, even though she stated that it was somewhat painful.  Movement examination: Tone: There is mild increased tone in the left greater than right upper extremity Abnormal movements: None Coordination:  There is significant decremation and slowness with RAM's bilaterally in the upper extremities Gait and Station: The patient requires assistance out of the wheelchair (to person). She is given a walker. She is short stepped with the right leg and drags the left.    Total time spent on today's visit was 30 minutes, including both face-to-face time and nonface-to-face time.  Time included that  spent on review of records (prior notes available to me/labs/imaging if pertinent), discussing treatment and goals, answering patient's questions and coordinating care.  Cc:  Myrlene Broker, MD

## 2020-02-24 ENCOUNTER — Other Ambulatory Visit: Payer: Self-pay

## 2020-02-24 ENCOUNTER — Encounter: Payer: Self-pay | Admitting: Neurology

## 2020-02-24 ENCOUNTER — Ambulatory Visit: Payer: PPO | Admitting: Neurology

## 2020-02-24 VITALS — BP 113/71 | HR 82 | Ht 61.0 in

## 2020-02-24 DIAGNOSIS — R471 Dysarthria and anarthria: Secondary | ICD-10-CM

## 2020-02-24 DIAGNOSIS — G20 Parkinson's disease: Secondary | ICD-10-CM | POA: Diagnosis not present

## 2020-02-24 MED ORDER — CARBIDOPA-LEVODOPA ER 25-100 MG PO TBCR: EXTENDED_RELEASE_TABLET | ORAL | 1 refills | Status: DC

## 2020-02-24 MED ORDER — CARBIDOPA-LEVODOPA ER 50-200 MG PO TBCR: 1.0000 | EXTENDED_RELEASE_TABLET | Freq: Every day | ORAL | 1 refills | Status: DC

## 2020-02-24 NOTE — Patient Instructions (Signed)
Take carbidopa/levodopa 25/100 CR, 2 tablets at 8am, 2 tablets at 11am, 2 tablets at 2 pm, 2 tablets at 5pm.  Take carbidopa/levodopa CR 50/200 at 9pm

## 2020-03-02 ENCOUNTER — Telehealth: Payer: Self-pay

## 2020-03-02 NOTE — Telephone Encounter (Signed)
Received call from [facility] stating they do not offer home physical therapy.   Spoke with Dr Tat and she suggested we send the patient a letter.   Letter mailed to patient.

## 2020-03-03 DIAGNOSIS — G4700 Insomnia, unspecified: Secondary | ICD-10-CM | POA: Diagnosis not present

## 2020-03-03 DIAGNOSIS — R269 Unspecified abnormalities of gait and mobility: Secondary | ICD-10-CM | POA: Diagnosis not present

## 2020-03-03 DIAGNOSIS — R0981 Nasal congestion: Secondary | ICD-10-CM | POA: Diagnosis not present

## 2020-03-03 DIAGNOSIS — M25511 Pain in right shoulder: Secondary | ICD-10-CM | POA: Diagnosis not present

## 2020-03-03 DIAGNOSIS — R399 Unspecified symptoms and signs involving the genitourinary system: Secondary | ICD-10-CM | POA: Diagnosis not present

## 2020-03-16 DIAGNOSIS — K219 Gastro-esophageal reflux disease without esophagitis: Secondary | ICD-10-CM | POA: Diagnosis not present

## 2020-03-16 DIAGNOSIS — M79601 Pain in right arm: Secondary | ICD-10-CM | POA: Diagnosis not present

## 2020-03-16 DIAGNOSIS — M542 Cervicalgia: Secondary | ICD-10-CM | POA: Diagnosis not present

## 2020-03-16 DIAGNOSIS — M25511 Pain in right shoulder: Secondary | ICD-10-CM | POA: Diagnosis not present

## 2020-03-16 DIAGNOSIS — K5909 Other constipation: Secondary | ICD-10-CM | POA: Diagnosis not present

## 2020-03-18 DIAGNOSIS — K589 Irritable bowel syndrome without diarrhea: Secondary | ICD-10-CM | POA: Diagnosis not present

## 2020-03-18 DIAGNOSIS — N3281 Overactive bladder: Secondary | ICD-10-CM | POA: Diagnosis not present

## 2020-03-18 DIAGNOSIS — G44229 Chronic tension-type headache, not intractable: Secondary | ICD-10-CM | POA: Diagnosis not present

## 2020-03-18 DIAGNOSIS — Z7952 Long term (current) use of systemic steroids: Secondary | ICD-10-CM | POA: Diagnosis not present

## 2020-03-18 DIAGNOSIS — G2581 Restless legs syndrome: Secondary | ICD-10-CM | POA: Diagnosis not present

## 2020-03-18 DIAGNOSIS — K219 Gastro-esophageal reflux disease without esophagitis: Secondary | ICD-10-CM | POA: Diagnosis not present

## 2020-03-18 DIAGNOSIS — I739 Peripheral vascular disease, unspecified: Secondary | ICD-10-CM | POA: Diagnosis not present

## 2020-03-18 DIAGNOSIS — M8580 Other specified disorders of bone density and structure, unspecified site: Secondary | ICD-10-CM | POA: Diagnosis not present

## 2020-03-18 DIAGNOSIS — G20 Parkinson's disease: Secondary | ICD-10-CM | POA: Diagnosis not present

## 2020-03-18 DIAGNOSIS — Z9181 History of falling: Secondary | ICD-10-CM | POA: Diagnosis not present

## 2020-03-18 DIAGNOSIS — K5904 Chronic idiopathic constipation: Secondary | ICD-10-CM | POA: Diagnosis not present

## 2020-03-18 DIAGNOSIS — G4700 Insomnia, unspecified: Secondary | ICD-10-CM | POA: Diagnosis not present

## 2020-03-18 DIAGNOSIS — J0100 Acute maxillary sinusitis, unspecified: Secondary | ICD-10-CM | POA: Diagnosis not present

## 2020-03-18 DIAGNOSIS — Z87440 Personal history of urinary (tract) infections: Secondary | ICD-10-CM | POA: Diagnosis not present

## 2020-03-18 DIAGNOSIS — M25511 Pain in right shoulder: Secondary | ICD-10-CM | POA: Diagnosis not present

## 2020-03-25 ENCOUNTER — Telehealth: Payer: Self-pay | Admitting: Neurology

## 2020-03-25 NOTE — Telephone Encounter (Signed)
Patient called in stating she got the letter and she appreciated it. She is now getting therapy in her home and is doing well with it. [facility] is doing her therapy and they do take her insurance.

## 2020-03-31 DIAGNOSIS — R399 Unspecified symptoms and signs involving the genitourinary system: Secondary | ICD-10-CM | POA: Diagnosis not present

## 2020-04-17 DIAGNOSIS — G20 Parkinson's disease: Secondary | ICD-10-CM | POA: Diagnosis not present

## 2020-04-17 DIAGNOSIS — Z9181 History of falling: Secondary | ICD-10-CM | POA: Diagnosis not present

## 2020-04-17 DIAGNOSIS — M25511 Pain in right shoulder: Secondary | ICD-10-CM | POA: Diagnosis not present

## 2020-04-17 DIAGNOSIS — M8580 Other specified disorders of bone density and structure, unspecified site: Secondary | ICD-10-CM | POA: Diagnosis not present

## 2020-04-17 DIAGNOSIS — G4700 Insomnia, unspecified: Secondary | ICD-10-CM | POA: Diagnosis not present

## 2020-04-17 DIAGNOSIS — N3281 Overactive bladder: Secondary | ICD-10-CM | POA: Diagnosis not present

## 2020-04-17 DIAGNOSIS — K5904 Chronic idiopathic constipation: Secondary | ICD-10-CM | POA: Diagnosis not present

## 2020-04-17 DIAGNOSIS — K589 Irritable bowel syndrome without diarrhea: Secondary | ICD-10-CM | POA: Diagnosis not present

## 2020-04-17 DIAGNOSIS — G2581 Restless legs syndrome: Secondary | ICD-10-CM | POA: Diagnosis not present

## 2020-04-17 DIAGNOSIS — G44229 Chronic tension-type headache, not intractable: Secondary | ICD-10-CM | POA: Diagnosis not present

## 2020-04-17 DIAGNOSIS — J0100 Acute maxillary sinusitis, unspecified: Secondary | ICD-10-CM | POA: Diagnosis not present

## 2020-04-17 DIAGNOSIS — I739 Peripheral vascular disease, unspecified: Secondary | ICD-10-CM | POA: Diagnosis not present

## 2020-04-17 DIAGNOSIS — Z87440 Personal history of urinary (tract) infections: Secondary | ICD-10-CM | POA: Diagnosis not present

## 2020-04-17 DIAGNOSIS — Z7952 Long term (current) use of systemic steroids: Secondary | ICD-10-CM | POA: Diagnosis not present

## 2020-04-17 DIAGNOSIS — K219 Gastro-esophageal reflux disease without esophagitis: Secondary | ICD-10-CM | POA: Diagnosis not present

## 2020-04-27 ENCOUNTER — Telehealth: Payer: Self-pay | Admitting: Neurology

## 2020-04-27 NOTE — Telephone Encounter (Signed)
[facility] needs a new prescription for her carbidopa-levodopa ER. They need to verify how the pt needs to take it. She says she is now starting to take it at 9am, 12pm, 3pm, and 6pm.

## 2020-04-28 NOTE — Telephone Encounter (Signed)
Spoke with pharmacist at [facility] who states the patient last filled her carbidopa levodopa  25/100 04/06/20 on and the earliest she could get the rx is 05/03/20. She states the patient was only given a thirty day supply. She states the the patient filled the other carbidopa levodopa 50/200 on 02/24/20 with a 90 day supply.   Spoke with patient and she states she wants a new rx that states the new times she takes her medication. (2 tabs qid) she wants to take her medication 1 hour after the times Dr Tat recommends. Informed her to take the medication the way Dr Tat prescribes it. She states she wants to take it a hour later. Advised her that I made Dr Tat aware and that I did not think Dr Tat would give her a new rx. Informed her that I would speak with Dr Tat and get back to her if Dr Tat was going to change the times on the rx. She voiced understanding.   Spoke with Dr Tat she is not agreeable with sending new rx to the pharmacy.

## 2020-04-28 NOTE — Telephone Encounter (Signed)
Spoke with pharmacy and was informed that the Cardbiodpa levodopa er 25/100 was filled 04/06/20 for 30 days and the other carbidopa levodopa was fill on 02/25/20 and not due for a refill until 05/27/19.  Spoke with Dr Tat who reviewed patients chart and  suggested I contact the pharmacy to verify the correct amount of tablets were distributed to the patient.   Spoke with pharmacy and was informed that the insurance only filled 240. She states the insurance will not cover 90 day supply.    So I will send over a new rx. Is this ok?

## 2020-04-28 NOTE — Telephone Encounter (Signed)
See me.  It sounds like even though she filled for 30 days she is running out before the 30 days?  The pharmacy can break a 90 day rx into 30 day supplies but not vice versa so they shouldn't need any more RX from me

## 2020-05-06 ENCOUNTER — Telehealth: Payer: Self-pay | Admitting: Neurology

## 2020-05-06 NOTE — Telephone Encounter (Signed)
Patient called and said the pharmacy doesn't have any refills on her carbidopa-levodopa CR 25-100 and she is out of the medication completely.   [facility] on [address]

## 2020-05-06 NOTE — Telephone Encounter (Signed)
[facility] to see if the patients prescription was ready. Pharmacist stated the rx was ready for pickup and has been for the last three days.   Contact patient and made her aware. She voiced understanding and thanked me for calling.

## 2020-05-10 DIAGNOSIS — N3091 Cystitis, unspecified with hematuria: Secondary | ICD-10-CM | POA: Diagnosis not present

## 2020-06-21 NOTE — Progress Notes (Unsigned)
Virtual Visit Via Video   The purpose of this virtual visit is to provide medical care while limiting exposure to the novel coronavirus.    Consent was obtained for video visit:  Yes.   Answered questions that patient had about telehealth interaction:  Yes.   I discussed the limitations, risks, security and privacy concerns of performing an evaluation and management service by telemedicine. I also discussed with the patient that there may be a patient responsible charge related to this service. The patient expressed understanding and agreed to proceed.  Pt location: Home Physician Location: office Name of referring provider:  Myrlene Broker, MD I connected with Alexis Douglas at patients initiation/request on 06/22/2020 at 11:15 AM EST by video enabled telemedicine application and verified that I am speaking with the correct person using two identifiers. Pt MRN:  [id number] Pt DOB:  1946-04-25 Video Participants:  Alexis Douglas;    Assessment/Plan:   1.  Parkinsons Disease -Biggest issue is multiple intolerances to medications.   We have done a levodopa challenge test and proved that the patient responded very well to levodopa, although she stated that she did not feel well on the immediate release. -continue carbidopa/levodopa 25/100 CR, 2 tablets at 8am, 2 tablets at 11am, 2 tablets at 2 pm, 2 tablets at 5pm.  -carbidopa/levodopa 50/200 CR at bedtime  -discussed merry walker  -congratulated her on doing the home therapy as she has been resistant to therapy in the past  2. Bladder incontinence -Intolerances to medications here have also been an issue.   -She is following with primary care.  Agree with primary care that samples of urine need to be collected every time and she is going tomorrow to f/u in that regard  3.  Dizziness  -sounds more like vertigo Subjective:   Alexis Douglas was seen today in follow up for Parkinsons  disease.  My previous records were reviewed prior to todays visit as well as outside records available to me.  We added carbidopa/levodopa 50/200 at bedtime last visit for cramping at bedtime.  She reports that shes doing ok in that regard.  Records are reviewed since our last visit.  She went to primary care on January 5 after presenting to urgent care with possible UTI.  They did not collect a sample and gave her doxycycline.  She has since called primary care about symptoms and she was told that a sample needed to be collected.  She did that and 10,000-50,000 strep gallolyticus were identified.  Reports that she just finished abx and is going tomorrow next week to be checked.  She is feeling better.  She is doing PT/OT. She has completed ST.  She reports a spinning sensation when she lays down at night (not in the day so much).  Noting some falling backward with PT since she has had covid  Current prescribed movement disorder medications:  carbidopa/levodopa 25/100 CR, 2 tablets at 8am, 2 tablets at 11am, 2 tablets at 2 pm, 2 tablets at 5pm (increased last visit) carbidopa/levodopa 50/200 CR at bed (started last visit)  Prior meds:Previous medication: Pramipexole (headache), carbidopa/levodopa immediate release (itching/nausea after one quarter of a tablet but was able to tolerate it during levodopa challenge and we proved efficacy but pt declined RX after that), carbidopa/levodopa extended release (dry eyes, swelling in the feet and toes, dizzy but went back on it and was able to tolerate); rytary (felt caused dry eye)  ALLERGIES:   Allergies  Allergen  Reactions  . Cyclobenzaprine Nausea Only  . Metronidazole Other (See Comments)    Unknown  . Mirapex [Pramipexole Dihydrochloride] Other (See Comments)    HA  . Moxifloxacin   . Sulfamethoxazole Itching  . Codeine Other (See Comments)    nervous  . Nitrofurantoin Other (See Comments)    Could not tolerate  . Nitrofurantoin Macrocrystal Rash   . Prochlorperazine Other (See Comments)    hyper  . Sulfa Antibiotics Itching and Rash  . Sulfamethoxazole-Trimethoprim Rash  . Tramadol Other (See Comments)    Deep sleep    CURRENT MEDICATIONS:  Outpatient Encounter Medications as of 06/22/2020  Medication Sig  . carbidopa-levodopa (SINEMET CR) 50-200 MG tablet Take 1 tablet by mouth at bedtime.  . Carbidopa-Levodopa ER (SINEMET CR) 25-100 MG tablet controlled release 2 tablets at 8am, 2 tablets at 11am, 2 tablets at 2 pm, 2 tablets at 5pm. (Patient taking differently: 2 tablets at 9am, 2 tablets at 12am, 2 tablets at 3 pm, 2 tablets at 6pm.)  . dexlansoprazole (DEXILANT) 60 MG capsule Take 60 mg by mouth daily.  Marland Kitchen dicyclomine (BENTYL) 10 MG capsule Take 10 mg by mouth every 6 (six) hours as needed.  Marland Kitchen estradiol (ESTRACE) 2 MG tablet Take 2 mg by mouth daily.  Marland Kitchen linaclotide (LINZESS) 72 MCG capsule Take 72 mcg by mouth daily.  . methocarbamol (ROBAXIN) 500 MG tablet Take 500 mg by mouth as needed for muscle spasms.  . sucralfate (CARAFATE) 1 g tablet Take 1 tablet by mouth in the morning, at noon, in the evening, and at bedtime. Takes prn   No facility-administered encounter medications on file as of 06/22/2020.    Objective:   PHYSICAL EXAMINATION:    VITALS:   Vitals:   06/22/20 0856  Weight: 114 lb (51.7 kg)  Height: 5\' 1"  (1.549 m)    GEN:  The patient appears stated age and is in NAD. HEENT:  Normocephalic, atraumatic.    Neurological examination:  Orientation: The patient is alert and oriented x3. Cranial nerves: There is good facial symmetry with facial hypomimia. The speech is fluent and quite dysarthric.  Sensation: Sensation is intact to light touch throughout  Movement examination:  Abnormal movements: None Coordination:  There is significant decremation and slowness with RAM's bilaterally in the upper extremities Gait and Station: not tested since at home and not there to assist   Follow up Instructions       -I discussed the assessment and treatment plan with the patient. The patient was provided an opportunity to ask questions and all were answered. The patient agreed with the plan and demonstrated an understanding of the instructions.   The patient was advised to call back or seek an in-person evaluation if the symptoms worsen or if the condition fails to improve as anticipated.    Total time spent on today's visit was 30 minutes, including both face-to-face time and nonface-to-face time.  Time included that spent on review of records (prior notes available to me/labs/imaging if pertinent), discussing treatment and goals, answering patient's questions and coordinating care.   Alonza Bogus, DO   Cc:  Myrlene Broker, MD

## 2020-06-22 ENCOUNTER — Encounter: Payer: Self-pay | Admitting: Neurology

## 2020-06-22 ENCOUNTER — Telehealth (INDEPENDENT_AMBULATORY_CARE_PROVIDER_SITE_OTHER): Payer: Medicare HMO | Admitting: Neurology

## 2020-06-22 ENCOUNTER — Other Ambulatory Visit: Payer: Self-pay

## 2020-06-22 VITALS — Ht 61.0 in | Wt 114.0 lb

## 2020-06-22 DIAGNOSIS — G20 Parkinson's disease: Secondary | ICD-10-CM

## 2020-06-22 DIAGNOSIS — R471 Dysarthria and anarthria: Secondary | ICD-10-CM

## 2020-07-20 ENCOUNTER — Telehealth: Payer: Self-pay | Admitting: Neurology

## 2020-07-20 NOTE — Telephone Encounter (Signed)
Patient states she is having problems sleeping at night. She feels like something is crawling on her back and she is having night sweats. Please call

## 2020-07-20 NOTE — Telephone Encounter (Signed)
telephone call to pt, Pt states it scares her so bad. Per pt the feeling of something crawling her back started last week. Last night she started having night sweats.   Pt is afraid to go to sleep because of the feeling  of something crawling on her back. "Please help I cant sleep"  Please send in something help go to sleep.

## 2020-07-20 NOTE — Telephone Encounter (Signed)
Those are things that she will need to f/u with PCP about

## 2020-07-21 NOTE — Telephone Encounter (Signed)
Patient advised.

## 2020-10-18 ENCOUNTER — Other Ambulatory Visit: Payer: Self-pay | Admitting: Neurology

## 2020-12-14 ENCOUNTER — Ambulatory Visit: Payer: Medicare HMO | Admitting: Neurology

## 2020-12-16 ENCOUNTER — Other Ambulatory Visit: Payer: Self-pay | Admitting: Neurology

## 2020-12-16 ENCOUNTER — Other Ambulatory Visit: Payer: Self-pay

## 2020-12-16 MED ORDER — CARBIDOPA-LEVODOPA ER 25-100 MG PO TBCR: EXTENDED_RELEASE_TABLET | ORAL | 0 refills | Status: DC

## 2020-12-23 ENCOUNTER — Ambulatory Visit: Payer: Medicare HMO | Admitting: Neurology

## 2020-12-28 NOTE — Progress Notes (Signed)
Assessment/Plan:   1.  Parkinsons Disease              -Biggest issue is multiple intolerances to medications/compliance.    We have done a levodopa challenge test and proved that the patient responded very well to levodopa, although she stated that she did not feel well on the immediate release.             -d/c carbidopa/levodopa 25/100 CR, 2 tablets at 8am, 2 tablets at 11am, 2 tablets at 2 pm, 2 tablets at 5pm.  -Trial Rytary, 245 mg, 1 capsule 4 times per day.  The patient has already let me know she is not going to tolerate it because it is "too strong."  Discussed with patient that generally with Rytary the dosages are 3 capsules 3 times per day.  Samples were provided.  She was willing to try it.  -Patient asked me what other options she had and I told her that, short of the Duopa pump, I was not sure she had other options.  We did discuss the Duopa pump today.  As above, the primary issue has been intolerances/perceived intolerances.             -declines PT  -Met with my LCSW today.  Have grave concerns that if we do not change her medication or have her better medicated that she will need more extensive care, potentially even palliative or hospice care in the future.     2.  Bladder incontinence             -Intolerances to medications here have also been an issue.      Subjective:   Alexis Douglas was seen today in follow up for Parkinsons disease.  My previous records were reviewed prior to todays visit as well as outside records available to me. Pt denies falls.  States that she is not taking the CR q hs.  States that it caused nighttime hallucinations/vivid dreams and feels that she is better off of it.  States that she is walking very little - just to transfer.  No longer able to move the L shoulder - told she may have frozen shoulder but she couldn't get into the position for xray.  C/o hot flashes - on estradiol - "not helping."  Current prescribed movement disorder  medications: carbidopa/levodopa 25/100 CR, 2 tablets at 8am, 2 tablets at 11am, 2 tablets at 2 pm, 2 tablets at 5pm (increased last visit) - states she is taking 2 at 9am, 2 at 12:30, 2 at 4 and 2 at 7:30pm carbidopa/levodopa 50/200 CR at bed   Prior meds:Previous medication: Pramipexole (headache), carbidopa/levodopa immediate release (itching/nausea after one quarter of a tablet but was able to tolerate it during levodopa challenge and we proved efficacy but pt declined RX after that), carbidopa/levodopa extended release;  rytary (felt caused dry eye); says that carbidopa/levodopa 50/200 caused vivid dreams/hallucinations.  ALLERGIES:   Allergies  Allergen Reactions   Cyclobenzaprine Nausea Only   Metronidazole Other (See Comments)    Unknown   Mirapex [Pramipexole Dihydrochloride] Other (See Comments)    HA   Moxifloxacin    Sulfamethoxazole Itching   Codeine Other (See Comments)    nervous   Nitrofurantoin Other (See Comments)    Could not tolerate   Nitrofurantoin Macrocrystal Rash   Prochlorperazine Other (See Comments)    hyper   Sulfa Antibiotics Itching and Rash   Sulfamethoxazole-Trimethoprim Rash   Tramadol Other (See Comments)  Deep sleep    CURRENT MEDICATIONS:  Outpatient Encounter Medications as of 12/29/2020  Medication Sig   dexlansoprazole (DEXILANT) 60 MG capsule Take 60 mg by mouth daily.   dicyclomine (BENTYL) 10 MG capsule Take 10 mg by mouth every 6 (six) hours as needed.   estradiol (ESTRACE) 2 MG tablet Take 2 mg by mouth daily.   linaclotide (LINZESS) 72 MCG capsule Take 72 mcg by mouth daily.   methocarbamol (ROBAXIN) 500 MG tablet Take 500 mg by mouth as needed for muscle spasms.   Carbidopa-Levodopa ER (SINEMET CR) 25-100 MG tablet controlled release TAKE 2 TABLETS AT 8AM, THEN 2 TABLETS 11AM, THEN 2 TABLETS AT 2PM, THEN 2 TABLETS AT 5PM (Patient taking differently: TAKE 2 TABLETS AT 9AM, THEN 2 TABLETS 12:30AM, THEN 2 TABLETS AT 4PM, THEN 2 TABLETS  AT 7:30PM)   [DISCONTINUED] carbidopa-levodopa (SINEMET CR) 50-200 MG tablet Take 1 tablet by mouth at bedtime. (Patient not taking: Reported on 12/29/2020)   [DISCONTINUED] sucralfate (CARAFATE) 1 g tablet Take 1 tablet by mouth in the morning, at noon, in the evening, and at bedtime. Takes prn (Patient not taking: Reported on 12/29/2020)   No facility-administered encounter medications on file as of 12/29/2020.    Objective:   PHYSICAL EXAMINATION:    VITALS:   Vitals:   12/29/20 1430  BP: 135/79  Pulse: 79  SpO2: 99%  Height: 5' (1.524 m)    GEN:  The patient appears stated age and is in NAD. HEENT:  Normocephalic, atraumatic.  The mucous membranes are moist.   Neurological examination:  Orientation: The patient is alert and oriented x3. Cranial nerves: There is good facial symmetry with significant facial hypomimia. The speech is fluent and dysarthric. Soft palate rises symmetrically and there is no tongue deviation. Hearing is intact to conversational tone. Sensation: Sensation is intact to light touch throughout Motor: Strength is at least antigravity x4.  Movement examination: Tone: There is moderate increased tone in the bilateral upper extremities, left greater than right (she is guarding the left arm/shoulder some) Abnormal movements: None seen Coordination:  There is significant slowness with all rapid alternating movements.  Difficult to tell whether there is decremation given the slowness.  She is unable to put her hands all the way to a fist to even do hand opening and closing. Gait and Station: Patient states she is unable.     Total time spent on today's visit was 74minutes, including both face-to-face time and nonface-to-face time.  Time included that spent on review of records (prior notes available to me/labs/imaging if pertinent), discussing treatment and goals, answering patient's questions and coordinating care.  Cc:  Myrlene Broker, MD

## 2020-12-29 ENCOUNTER — Encounter: Payer: Self-pay | Admitting: Neurology

## 2020-12-29 ENCOUNTER — Ambulatory Visit: Payer: Medicare HMO | Admitting: Neurology

## 2020-12-29 ENCOUNTER — Other Ambulatory Visit: Payer: Self-pay

## 2020-12-29 VITALS — BP 135/79 | HR 79 | Ht 60.0 in

## 2020-12-29 DIAGNOSIS — R471 Dysarthria and anarthria: Secondary | ICD-10-CM | POA: Diagnosis not present

## 2020-12-29 DIAGNOSIS — G20 Parkinson's disease: Secondary | ICD-10-CM

## 2020-12-29 MED ORDER — RYTARY 61.25-245 MG PO CPCR: 245.0000 mg | ORAL_CAPSULE | Freq: Four times a day (QID) | ORAL | 0 refills | Status: DC

## 2020-12-29 NOTE — Patient Instructions (Addendum)
Stop carbidopa/levodopa 25/100 CR  Start Rytary 245 mg, 1 capsule four times per day

## 2020-12-30 ENCOUNTER — Ambulatory Visit: Payer: Medicare HMO | Admitting: Neurology

## 2021-01-27 ENCOUNTER — Telehealth: Payer: Self-pay | Admitting: Neurology

## 2021-01-27 NOTE — Telephone Encounter (Signed)
Called patient back to ask what times she is taking te Rytary and she said she is taking it at 9am/1pm/5pm/9pm very hard to understand patient but I di gather she had cataract surgery and since then she has noticed a difference in her medication. She also said her body and mouth are locking up and she feels that it is dure to the [place]

## 2021-01-27 NOTE — Telephone Encounter (Signed)
Spoke to patient and she has agreed to try the medication change and do 2/2/1/1 and let us know how that is working for her

## 2021-01-27 NOTE — Telephone Encounter (Signed)
Pt called in stating she would like to know how often she is supposed to take her Rytary? She says when the medicine wears off, her whole body locks up all of a sudden. Including her mouth.

## 2021-02-04 ENCOUNTER — Telehealth: Payer: Self-pay | Admitting: Neurology

## 2021-02-04 ENCOUNTER — Other Ambulatory Visit: Payer: Self-pay

## 2021-02-04 DIAGNOSIS — G20 Parkinson's disease: Secondary | ICD-10-CM

## 2021-02-04 MED ORDER — RYTARY 61.25-245 MG PO CPCR: 245.0000 mg | ORAL_CAPSULE | Freq: Four times a day (QID) | ORAL | 0 refills | Status: DC

## 2021-02-04 NOTE — Telephone Encounter (Signed)
Pt called and needs a call back regarding her rytary.

## 2021-02-04 NOTE — Telephone Encounter (Signed)
Called patient back very very hard to understand her. I did gather she has not been taking her medicine Rytary as we had discussed 2/2/1/1 and I did tell her she had to try for a least a couple of weeks before we know if it is not working. She asked me to send in a new prescription for her and I have

## 2021-02-07 ENCOUNTER — Telehealth: Payer: Self-pay | Admitting: Neurology

## 2021-02-07 ENCOUNTER — Other Ambulatory Visit: Payer: Self-pay

## 2021-02-07 MED ORDER — CARBIDOPA-LEVODOPA ER 61.25-245 MG PO CPCR: 245.0000 mg | ORAL_CAPSULE | Freq: Four times a day (QID) | ORAL | 3 refills | Status: DC

## 2021-02-07 NOTE — Telephone Encounter (Signed)
Pt's husband called in stating the prescription that was sent in was only 120 pills, but it's supposed to be 180. He wants to see if she is supposed to cut back her normal 6 pills a day or if that was an error?

## 2021-02-18 ENCOUNTER — Telehealth: Payer: Self-pay | Admitting: Neurology

## 2021-02-18 NOTE — Telephone Encounter (Signed)
Patient called and said she is experiencing bad hallucinations. She is requesting help.

## 2021-02-18 NOTE — Telephone Encounter (Signed)
Pt stated that taken the rytary is causing rea;ly bad hallucinations. She is having visions of snakes on her at night. She cant sleep, she is dizzy. She stated the hallucinations and dizziness is really bad but the rytary helps with the parkinson she just cant take the hallucinations and she is sorry, pt crying on the phone

## 2021-02-21 NOTE — Telephone Encounter (Signed)
Called patient and she wants to stay on Rytary dose that she is currently taking for now and see if this clears up she will call if she needs prescription for Carbidopa Levodopa 25-100 CR

## 2021-04-13 ENCOUNTER — Telehealth: Payer: Self-pay

## 2021-04-13 NOTE — Telephone Encounter (Signed)
I have called and LVM or patient to call back and schedule recall colonoscopy.

## 2021-06-29 ENCOUNTER — Encounter: Payer: Self-pay | Admitting: Gastroenterology

## 2021-07-06 NOTE — Progress Notes (Signed)
Virtual Visit Via Video   The purpose of this virtual visit is to provide medical care while limiting exposure to the novel coronavirus.    Consent was obtained for video visit:  Yes.   Answered questions that patient had about telehealth interaction:  Yes.   I discussed the limitations, risks, security and privacy concerns of performing an evaluation and management service by telemedicine. I also discussed with the patient that there may be a patient responsible charge related to this service. The patient expressed understanding and agreed to proceed.  Pt location: Home Physician Location: office Name of referring provider:  Myrlene Broker, MD I connected with Alexis Douglas at patients initiation/request on 07/07/2021 at  3:30 PM EST by video enabled telemedicine application and verified that I am speaking with the correct person using two identifiers. Pt MRN:  [id number] Pt DOB:  08-22-1945 Video Participants:  Alexis Douglas;    Assessment/Plan:   1.  Parkinsons Disease              -Biggest issue is multiple intolerances to medications/compliance.    We have done a levodopa challenge test and proved that the patient responded very well to levodopa, although she stated that she did not feel well on the immediate release.  -For now, she will continue the Rytary to 45 mg, 1 tablet 4 times per day.  This is not enough and would really like to see her on the higher recommended dose of 2/2/1/1.  She asked me the difference between Rytary and the carbidopa/levodopa 25/100 CR and we discussed this.  -Patient declined the Duopa pump.             -We discussed the value of physical therapy and I asked her if we could send some into the home.  Unfortunately, I could not understand her answer.  -Have grave concerns that if we do not change her medication or have her better medicated that she will need more extensive care, potentially even palliative or hospice care in the future.      2.  Bladder incontinence             -Intolerances to medications here have also been an issue.    3.  Patient's video froze 20 minutes into the video.  We tried to reconnect and while she was still there, her connection still stated that it was poor and she was frozen on her end.  Her dysarthria was too significant to understand her to complete a phone visit, so she will be rescheduled.  Subjective:   Alexis Douglas was seen today in follow up for Parkinsons disease.  My previous records were reviewed prior to todays visit as well as outside records available to me.  At our last visit, I expressed serious concerns with the patient that if we did not trial other medication or do something differently that she is going to need more extensive care, potentially even palliative or hospice care.  She was ultimately agreeable to trying Rytary, but let me know before we even trialed it that she was not going to tolerate it.  Not surprisingly, she called back and stated that her "body and mouth are locking up" and she felt it was due to the [place].  After some discussion, it was felt that it was really the Rytary wearing off that was causing it MBE.  We ultimately told her to increase the medication to Rytary 245 mg, 2/2/1/1.  Today, she  states that she is on Rytary 245 mg, 1 tablet 4 times per day.  She initially stated that she was having hallucinations on higher dosage, but upon further questioning, she stated that hallucinations were vivid dreams.  When I asked her in more detail, she then stated that she saw a woman that was not there.  History was very difficult due to dysarthria.  Patient's husband was apparently in the room, but did not participate.  Patient also complained about blurred vision that she thought was from the [place].  Stated that her son had COVID and she developed weakness after this.  She never tested for COVID, but wondered if she had it.  States that she is no longer able to walk  because her feet stick to the ground.  Current prescribed movement disorder medications: rytary 245 mg, 2/2/1/1but she is taking 1 po qid    Prior meds:Previous medication: Pramipexole (headache), carbidopa/levodopa immediate release (itching/nausea after one quarter of a tablet but was able to tolerate it during levodopa challenge and we proved efficacy but pt declined RX after that), carbidopa/levodopa extended release;  rytary (felt caused dry eye); says that carbidopa/levodopa 50/200 caused vivid dreams/hallucinations.  ALLERGIES:   Allergies  Allergen Reactions   Cyclobenzaprine Nausea Only   Metronidazole Other (See Comments)    Unknown   Mirapex [Pramipexole Dihydrochloride] Other (See Comments)    HA   Moxifloxacin    Sulfamethoxazole Itching   Codeine Other (See Comments)    nervous   Nitrofurantoin Other (See Comments)    Could not tolerate   Nitrofurantoin Macrocrystal Rash   Prochlorperazine Other (See Comments)    hyper   Sulfa Antibiotics Itching and Rash   Sulfamethoxazole-Trimethoprim Rash   Tramadol Other (See Comments)    Deep sleep    CURRENT MEDICATIONS:  Outpatient Encounter Medications as of 07/07/2021  Medication Sig   Carbidopa-Levodopa ER (RYTARY) 61.25-245 MG CPCR Take 245 mg by mouth 4 (four) times daily.   Carbidopa-Levodopa ER 61.25-245 MG CPCR Take 245 mg by mouth 4 (four) times daily. 2 AM / 2AM / 1 afternoon / 1 at night   dexlansoprazole (DEXILANT) 60 MG capsule Take 60 mg by mouth daily.   dicyclomine (BENTYL) 10 MG capsule Take 10 mg by mouth every 6 (six) hours as needed.   estradiol (ESTRACE) 2 MG tablet Take 2 mg by mouth daily.   linaclotide (LINZESS) 72 MCG capsule Take 72 mcg by mouth daily.   methocarbamol (ROBAXIN) 500 MG tablet Take 500 mg by mouth as needed for muscle spasms.   No facility-administered encounter medications on file as of 07/07/2021.    Objective:   PHYSICAL EXAMINATION:    VITALS:   There were no vitals  filed for this visit.   GEN:  The patient appears stated age and is in NAD. HEENT:  Normocephalic, atraumatic.  The mucous membranes are moist.   Neurological examination:  Orientation: The patient is alert and oriented x3. Cranial nerves: There is good facial symmetry with significant facial hypomimia. The speech is fluent and dysarthric.  Hearing is intact to conversational tone. Motor: Strength is at least antigravity x4.  Movement examination: Tone: unable via video Abnormal movements: None seen Gait and Station: Patient states she is unable.   Follow up Instructions      -I discussed the assessment and treatment plan with the patient. The patient was provided an opportunity to ask questions and all were answered. The patient agreed with the plan and demonstrated an  understanding of the instructions.   The patient was advised to call back or seek an in-person evaluation if the symptoms worsen or if the condition fails to improve as anticipated.    Total time spent on today's visit was 76minutes, including both face-to-face time and nonface-to-face time.  Time included that spent on review of records (prior notes available to me/labs/imaging if pertinent), discussing treatment and goals, answering patient's questions and coordinating care.   Alonza Bogus, DO   Cc:  Myrlene Broker, MD

## 2021-07-07 ENCOUNTER — Other Ambulatory Visit: Payer: Self-pay

## 2021-07-07 ENCOUNTER — Telehealth (INDEPENDENT_AMBULATORY_CARE_PROVIDER_SITE_OTHER): Payer: Medicare Other | Admitting: Neurology

## 2021-07-07 DIAGNOSIS — G20 Parkinson's disease: Secondary | ICD-10-CM

## 2021-07-13 ENCOUNTER — Telehealth: Payer: Self-pay | Admitting: Neurology

## 2021-07-13 NOTE — Telephone Encounter (Signed)
Called patient and was unable to make out what patient was saying at all. I told patient I could not give her any medical advice at this time.I was able to get her husband on the phone but he wasn't sure really either. I did let Renessa  know Dr. Carles Collet would see her at the appointment  ?

## 2021-07-13 NOTE — Telephone Encounter (Signed)
Patient called and said she was dizzy.  She was asking if she could take medication.  It was really difficult to understand her.   ?

## 2021-07-25 ENCOUNTER — Other Ambulatory Visit: Payer: Self-pay | Admitting: Neurology

## 2021-07-25 DIAGNOSIS — G20 Parkinson's disease: Secondary | ICD-10-CM

## 2021-07-26 ENCOUNTER — Other Ambulatory Visit: Payer: Self-pay

## 2021-07-27 ENCOUNTER — Telehealth: Payer: Self-pay | Admitting: Neurology

## 2021-07-27 NOTE — Telephone Encounter (Signed)
Working on PA

## 2021-07-27 NOTE — Telephone Encounter (Signed)
Patient's spouse called and said the patient will run of of her Rytary before her next appointment on 08/01/21 and it is needing a PA. ? ?He'd like to know if she can get a partial Rx before the visit or what can be done? ? ?[facility] [address] in [place] ?Phone: [phone] ?

## 2021-07-27 NOTE — Telephone Encounter (Signed)
Pt LM with AN. Need a refill on her medication, needs PA. ?

## 2021-07-28 NOTE — Telephone Encounter (Signed)
PA approved Husband called ?

## 2021-07-28 NOTE — Telephone Encounter (Signed)
Patients husband called back to check on medication.  He stated she will run out before next appointment. ?

## 2021-07-28 NOTE — Telephone Encounter (Signed)
Called patients husband back to let him know the hold up is that I did not have the current insurance information for the patient. Information was gathered and PA has been put in for this patient  ?

## 2021-07-29 NOTE — Progress Notes (Signed)
? ? ?Assessment/Plan:  ? ?1.  Parkinsons Disease ?  ?            -Biggest issue is multiple intolerances to medications/compliance.    We have done a levodopa challenge test and proved that the patient responded very well to levodopa, although she stated that she did not feel well on the immediate release. ?            -For now, she will continue the Rytary 245 mg, and told her she needed to try and increase the medication.  She will try to take 2 at 9am, 2 at 1pm, 1 at 5pm, 1 at 9pm.   ?            -Patient declined the Duopa pump. ?            -Have grave concerns that if we do not change her medication or have her better medicated that she will need more extensive care, potentially even palliative or hospice care in the future.  I discussed this with her in detail again today. ?  ?   ?2.  Bladder incontinence ?            -Intolerances to medications here have also been an issue.  She is awaiting a urology appointment in May. ? ? ? ?Subjective:  ? ?Hollie Salk Lichty was seen today in follow up for Parkinsons disease.  My previous records were reviewed prior to todays visit as well as outside records available to me.  Husband is in waiting room but he said she didn't want him back in room. Pt denies falls.   She did have a freezing episode in the bathroom where L leg got stuck and fell into the WC.  Not sure how long ago.  States that she is walking when her husband is with her and states that she walks him daily (note that husband was asked the same question in the lobby and he stated that she did not walk at all, with the exception of into the bathroom where her wheelchair does not fit and she will transfer and there with him).  No longer able to move the L shoulder - told she may have frozen shoulder but she couldn't get into the position for xray.    ? ?More trouble with feeding self.  More trouble with choking and feeding self.  ? ?Current prescribed movement disorder medications: ?Rytary 245 mg, 1 tablet 4  times per day (had recommended 2/2/1/1, but patient reported unable to tolerate) ? ?  ?Prior meds:Previous medication: Pramipexole (headache), carbidopa/levodopa immediate release (itching/nausea after one quarter of a tablet but was able to tolerate it during levodopa challenge and we proved efficacy but pt declined RX after that), carbidopa/levodopa extended release;  rytary (felt caused dry eye); says that carbidopa/levodopa 50/200 caused vivid dreams/hallucinations. ? ?ALLERGIES:   ?Allergies  ?Allergen Reactions  ? Cyclobenzaprine Nausea Only  ? Metronidazole Other (See Comments)  ?  Unknown  ? Mirapex [Pramipexole Dihydrochloride] Other (See Comments)  ?  HA  ? Moxifloxacin   ? Sulfamethoxazole Itching  ? Codeine Other (See Comments)  ?  nervous  ? Nitrofurantoin Other (See Comments)  ?  Could not tolerate  ? Nitrofurantoin Macrocrystal Rash  ? Prochlorperazine Other (See Comments)  ?  hyper  ? Sulfa Antibiotics Itching and Rash  ? Sulfamethoxazole-Trimethoprim Rash  ? Tramadol Other (See Comments)  ?  Deep sleep  ? ? ?CURRENT MEDICATIONS:  ?Outpatient Encounter Medications  as of 08/01/2021  ?Medication Sig  ? Carbidopa-Levodopa ER (RYTARY) 61.25-245 MG CPCR Take 245 mg by mouth 4 (four) times daily.  ? Carbidopa-Levodopa ER (RYTARY) 61.25-245 MG CPCR Take 1 tablet by mouth 4 (four) times daily.  ? dexlansoprazole (DEXILANT) 60 MG capsule Take 60 mg by mouth daily.  ? dicyclomine (BENTYL) 10 MG capsule Take 10 mg by mouth every 6 (six) hours as needed.  ? estradiol (ESTRACE) 2 MG tablet Take 2 mg by mouth daily.  ? linaclotide (LINZESS) 72 MCG capsule Take 72 mcg by mouth daily.  ? methocarbamol (ROBAXIN) 500 MG tablet Take 500 mg by mouth as needed for muscle spasms.  ? ?No facility-administered encounter medications on file as of 08/01/2021.  ? ? ?Objective:  ? ?PHYSICAL EXAMINATION:   ? ?VITALS:   ?Vitals:  ? 08/01/21 1523  ?BP: 122/75  ?Pulse: 62  ?SpO2: 99%  ?Weight: 122 lb (55.3 kg)  ?Height: 5' (1.524 m)   ? ?Wt Readings from Last 3 Encounters:  ?08/01/21 122 lb (55.3 kg)  ?06/22/20 114 lb (51.7 kg)  ?12/17/19 122 lb (55.3 kg)  ? ? ? ? ?GEN:  The patient appears stated age and is in NAD. ?HEENT:  Normocephalic, atraumatic.  The mucous membranes are moist.  ? ?Neurological examination: ? ?Orientation: The patient is alert and oriented x3. ?Cranial nerves: There is good facial symmetry with significant facial hypomimia. The speech is fluent and dysarthric. Soft palate rises symmetrically and there is no tongue deviation. Hearing is intact to conversational tone. ?Sensation: Sensation is intact to light touch throughout ?Motor: Strength is at least antigravity x4. ? ?Movement examination: ?Tone: There is moderate increased tone in the bilateral upper extremities, left greater than right (she is guarding the left arm/shoulder some) ?Abnormal movements: None seen ?Coordination:  There is significant slowness with all rapid alternating movements.  Difficult to tell whether there is decremation given the slowness.  She is unable to put her hands all the way to a fist to even do hand opening and closing. ?Gait and Station: We attempted to get her out of the chair with 2 person assist and patient states that she just is unable.  States that she would fall backward.  Refuses to try. ? ? ? ? ?Total time spent on today's visit was 50mnutes, including both face-to-face time and nonface-to-face time.  Time included that spent on review of records (prior notes available to me/labs/imaging if pertinent), discussing treatment and goals, answering patient's questions and coordinating care. ? ?Cc:  RMyrlene Broker MD ? ?

## 2021-08-01 ENCOUNTER — Encounter: Payer: Self-pay | Admitting: Neurology

## 2021-08-01 ENCOUNTER — Ambulatory Visit: Payer: Medicare Other | Admitting: Neurology

## 2021-08-01 ENCOUNTER — Other Ambulatory Visit: Payer: Self-pay

## 2021-08-01 DIAGNOSIS — G20 Parkinson's disease: Secondary | ICD-10-CM | POA: Diagnosis not present

## 2021-08-01 MED ORDER — RYTARY 61.25-245 MG PO CPCR: ORAL_CAPSULE | ORAL | 1 refills | Status: DC

## 2021-08-01 NOTE — Patient Instructions (Addendum)
Take rytary 245 mg,  2 at 9am, 2 at 1pm, 1 at 5pm, 1 at 9pm ? ?Local and Online Resources for Power over Parkinson's Group ?March 2023 ? ?LOCAL [place] PARKINSON'S GROUPS  ?Power over Parkinson's Group :   ?Power Over Parkinson's Patient Education Group will be Wednesday, March 8th-*Hybrid meting*- in person at [place] location and via [facility] at 2:00 pm.   ?Upcoming Power over Parkinson's Meetings:  2nd Wednesdays of the month at 2 pm:  March 8th, April 12th ?Contact Amy Marriott at [email or web address] if interested in participating in this group ?Parkinson's Care Partners Group:    3rd Mondays, Contact Misty Paladino ?Atypical Parkinsonian Patient Group:   4th Wednesdays, Contact Misty Paladino ?If you are interested in participating in these groups with Misty, please contact her directly for how to join those meetings.  Her contact information is [email or web address].   ? ?LOCAL EVENTS AND NEW OFFERINGS ?Parkinson's Wellness Event:  Pottery Night at [facility].  Friday, March 10th 5:30-7:30 pm.  Sponsored by [employer] [place].  FREE event for people with Parkinson's and care partners.  RSVP to [facility] at [email or web address] ?Dine out at [employer].  Celebrate Parkinson's disease Awareness Month and Support the Parkinson's Movement Disorder Fund.   Wednesday, April 19th 4-6 pm at [place], [employer].  (Give receipt to cashier and 20% will be donated) ?Parkinson's T-shirts for sale!  Designed by a local group member, with funds going to [place].  $20.00  Contact Misty to purchase (see email above) ?New PWR! Moves Class offering at [employer]!  Fridays 1-2 pm in March (likely to switch days and times after March).  Come try it out and see if PWR! Moves is a good fit for your exercise routine!  Contact Amy Marriott for details:  [email or web address] ?Hamil-Kerr Challenge Bike, Run, Walk for PD, PSP, MSA.     Saturday, April 8th at 8 am.  [place]  Proceeds go to offset costs of exercise programs locally.  To Register, visit website:  www.[email or web address] ? ?ONLINE EDUCATION AND SUPPORT ?[place]:  www.parkinson.org ?PD Health at Home continues:  Mindfulness Mondays, Expert Briefing Tuesdays, Wellness Wednesdays, Take Time Thursdays, Fitness Fridays  ?Upcoming Education:  ?Parkinson's and Medications:  What's New.  Wednesday, March 8th at 1:00 pm. ?Freezing and Fall Prevention in Parkinson's.  Wednesday, April 12th at 1:00 pm ?Register for Armed forces operational officer) at WatchCalls.si ?[place] Parkinson's Symposium: Cognition Changes- a free in-person ([place], [place]) and online Audiological scientist) event for people with Parkinson's and their loved ones. During this event we will explore new treatments and practical strategies for addressing these changes.  Saturday, April 1st, 10 am-1 pm.  Register at [employer].[email or web address]/ or contact [place] at [phone] or [email or web address]. ? Please check out their website to sign up for emails and see their full online offerings ? ?[place]:  www.[email or web address]  ?Third Thursday Webinars:  On the third Thursday of every month at 12 p.m. ET, join our free live webinars to learn about various aspects of living with Parkinson's disease and our work to speed medical breakthroughs. ?Upcoming Webinar:  The Power of Women in Philanthropy.  Tues, March 28th at  12 pm. ?Check out additional information on their website to see their full online offerings ? ?[place]:  www.[email or web address] ?Upcoming Webinar:   Stay tuned ?Webinar Series:  Living with Parkinson's Meetup.   Third Thursdays of each month at 3 pm ?Care Partner Monthly  Meetup.  With Robin Searing Phinney.  First Tuesday of each month, 2 pm ?Check  out additional information to Live Well Today on their website ? ?Parkinson and Movement Disorders (PMD) Alliance:  www.pmdalliance.org ?[employer] Online:  Online Education Events ?Sign up for emails, which are sent weekly to give you updates on programming and online offerings ? ?Parkinson's Association of the [place]:  www.parkinsonassociation.org ?Information on online support groups, education events, and online exercises including Yoga, Parkinson's exercises and more-LOTS of information on links to PD resources and online events ?Virtual Support Group through [employer] of the [place]; next one is scheduled for Wednesday, *April 5th at 2 pm. *March meeting has been cancelled. (These are typically scheduled for the 1st Wednesday of the month at 2 pm).  Visit website for details. ?MOVEMENT AND EXERCISE OPPORTUNITIES ?Parkinson's DRUMMING Classes/Music Therapy with Doylene Canning:  This is a returning class and it's FREE!  2nd Mondays, continuing March 13th , 11:00 at the [place].  Contact *Misty Taylor-Paladino at [email or web address] or Doylene Canning at [phone] or [email or web address]  ?PWR! Moves Classes at [place].  Wednesdays 10 and 11 am.   NEW PWR! Moves Class offering at [employer].  Fridays 1-2 pm.  Contact Amy Marriott, PT [email or web address] if interested. ?Here is a link to the PWR!Moves classes on Zoom from [place] - Daily Mon-Sat at 10:00. Via Zoom, FREE and open to all.  There is also a link below via Facebook if you use that platform. ?[email or web address].si ?[email or web address]://[email or web address] ? ?Parkinson's Wellness Recovery (PWR! Moves)  www.pwr4life.org ?Info on the PWR! Virtual Experience:   You will have access to our expertise through self-assessment, guided plans that start with the PD-specific fundamentals, educational content, tips, Q&A with an expert, and a growing Art therapist of PD-specific pre-recorded and live exercise classes of varying types and intensity - both physical and cognitive! If that is not enough, we offer 1:1 wellness consultations (in-person or virtual) to personalize your PWR! Research scientist (medical).  ?[employer] Fridays:  ?As part of the PD Health @ Home program, this free video series focuses each week on one aspect of fitness designed to support people living with Parkinson's.  These weekly videos highlight the [place] recent fitness guidelines for people with Parkinson's disease. ?www.KVTVnet.com.cy ?Dance for PD website is offering free, live-stream classes throughout the week, as well as links to [employer] of classes:  https://[email or web address]/ ?Dance for Parkinson's in-person class.  February 1-April 26, Wednesdays 4-5 pm.  Free class for people with Parkinson's disease, at [address], [place], [place].  Contact [phone] or [email or web address] to register ?Virtual dance and Pilates for Parkinson's classes: Click on the Community Tab> Parkinson's Movement Initiative Tab.  To register for classes and for more information, visit www.[email or web address].co.za and click the ?community? tab.  ?[employer] Parkinson's Cycling Classes  ?[employer]:  Thursdays @ Noon-Live classes at [employer] ([employer] at [email or web address] or [phone]) ?[employer]: Virtual Classes Mondays and Thursdays Jeanette Caprice classes Tuesday, Wednesday and Thursday (contact [place] at [email or web address]  or [phone]) ?[employer] ?Varied levels of classes are offered Mondays, Tuesdays and Thursdays at [employer].  ?Stretching with Verdis Frederickson  weekly class is also offered for people with Parkinson's ?To observe a class or for more information, call [phone] or email Hezzie Bump at [email or web address] ?ADDITIONAL SUPPORT AND RESOURCES ?We

## 2021-09-09 ENCOUNTER — Telehealth: Payer: Self-pay | Admitting: Neurology

## 2021-09-09 ENCOUNTER — Other Ambulatory Visit: Payer: Self-pay

## 2021-09-09 DIAGNOSIS — G20 Parkinson's disease: Secondary | ICD-10-CM

## 2021-09-09 MED ORDER — RYTARY 61.25-245 MG PO CPCR: ORAL_CAPSULE | ORAL | 1 refills | Status: DC

## 2021-09-09 NOTE — Telephone Encounter (Signed)
Patient's called with concerns, I think, about her medications. It was difficult to understand her so I asked if she could get her husband on the line. ? ?She tried to connect her husband into the call without success, the call dropped. ? ?I tried to call her back and spoke with her husband.  ? ?He said her Rytary prescription they picked up yesterday was only for 4 tablets a day and she should be taking 6 a day. It was for 120 tablets it should have been for 180 tablets monthly. ? ?[facility] in [place], [place] ? ? ?

## 2021-09-12 NOTE — Telephone Encounter (Signed)
Called patients husband back and we discussed using [facility] to try to save some money for the patient. Prescription has been sent to [facility] for Rytary ?

## 2021-10-28 ENCOUNTER — Telehealth: Payer: Self-pay | Admitting: Neurology

## 2021-10-28 NOTE — Telephone Encounter (Signed)
Pt stated that she wants to think about it and she will call back and let us know,

## 2021-10-28 NOTE — Telephone Encounter (Signed)
Patient called and stated she someone to call her back today.

## 2021-10-28 NOTE — Telephone Encounter (Signed)
Pt called I could not understand her, her husband came to the phone and stated that she has started having an increase in drooling where is has to use a rag to catch it and it is also so much she can't swallow at times . Stated this as been going on for 2 - 3 weeks, asking if this is from her parkinson's and what can we do to help her?

## 2021-11-04 ENCOUNTER — Telehealth: Payer: Self-pay

## 2022-01-04 ENCOUNTER — Telehealth: Payer: Self-pay | Admitting: Neurology

## 2022-01-04 DIAGNOSIS — G20 Parkinson's disease: Secondary | ICD-10-CM

## 2022-01-04 NOTE — Telephone Encounter (Signed)
Called patient and it was very difficult to understand her. Requested to speak to patients husband, but she informed me that he was not home. From what I could gather the Rytary is $176 and patient cannot afford that. Patient provided husbands number Alexis Douglas at [phone].   Called Alexis Douglas and he informed me that patients insurance was only filling patients 120 pills a month so patient is going to run out of her Rytary. Alexis Douglas stated that he called the insurance to ask about this and they informed him that it is only this month that they are filling the 120 and next month it will go back to 130 pills.   Patients husband also stated that patient is wanting to see about going back on the Carbidopa Levodopa to see if that will help her with her speech. I informed patients husband I will send this message to Dr. Carles Collet and give them a call back tomorrow.

## 2022-01-04 NOTE — Telephone Encounter (Signed)
Please call back, patient called again.

## 2022-01-04 NOTE — Telephone Encounter (Signed)
Patient requested a call back from a nurse. She has a question about her medication.

## 2022-01-05 MED ORDER — RYTARY 61.25-245 MG PO CPCR: ORAL_CAPSULE | ORAL | 0 refills | Status: DC

## 2022-01-05 NOTE — Telephone Encounter (Signed)
Called patients husband and informed him of Dr. Doristine Devoid advice of Levodopa variations not helping with patients speech. We proved through levodopa challenge test (on/off) test a long time along that she did well walking on the IR version but she won't take it.  Compliance has been a consistent long term issue with her unfortunately. Patients husband verbalized understanding. Patients husband is requesting a 30 day supply of Rytary to be sent to pharmacy as she came up short with her pills due to insurance not filling her full amount needed because it was not allowed with her insurance. Therefore patient is needing a 30 day until her appt with Dr. Carles Collet next month.

## 2022-02-02 ENCOUNTER — Ambulatory Visit: Payer: Medicare Other | Admitting: Neurology

## 2022-02-15 ENCOUNTER — Other Ambulatory Visit: Payer: Self-pay

## 2022-02-15 ENCOUNTER — Telehealth: Payer: Self-pay | Admitting: Neurology

## 2022-02-15 DIAGNOSIS — R471 Dysarthria and anarthria: Secondary | ICD-10-CM

## 2022-02-15 DIAGNOSIS — G20B1 Parkinson's disease with dyskinesia, without mention of fluctuations: Secondary | ICD-10-CM

## 2022-02-15 MED ORDER — CARBIDOPA-LEVODOPA ER 25-100 MG PO TBCR: EXTENDED_RELEASE_TABLET | ORAL | 0 refills | Status: DC

## 2022-02-15 NOTE — Telephone Encounter (Signed)
Patients husband said she needs a refill. Patients husband said she is a lot worse since March when she started taking the Rytary. The  price of Rytary isn't worth what they are paying for it and they feel it isn't helping. Patient would like to go back to Carbidopa levodopa and would like me to send in refill to her next appointment

## 2022-02-15 NOTE — Telephone Encounter (Signed)
Patient's spouse called to discuss patient's medication before requesting refills.

## 2022-02-15 NOTE — Telephone Encounter (Signed)
Called patients husband and explained its important to attend appointments for times like this. I have adjusted the chart and take out  Rytary and add carbidopa levodopa CR  and dosage

## 2022-03-16 ENCOUNTER — Encounter: Payer: Self-pay | Admitting: Neurology

## 2022-05-18 ENCOUNTER — Other Ambulatory Visit: Payer: Self-pay | Admitting: Neurology

## 2022-05-18 ENCOUNTER — Encounter: Payer: Self-pay | Admitting: Neurology

## 2022-05-18 DIAGNOSIS — R471 Dysarthria and anarthria: Secondary | ICD-10-CM

## 2022-05-18 DIAGNOSIS — G20B1 Parkinson's disease with dyskinesia, without mention of fluctuations: Secondary | ICD-10-CM

## 2022-05-19 ENCOUNTER — Telehealth: Payer: Self-pay | Admitting: Neurology

## 2022-05-19 NOTE — Telephone Encounter (Signed)
Patient called and I had a very hard time understanding her, I asked to speak to patient husband and she would not let me. She states that her daughter ( it sounded like ) Shona Simpson would be calling next week to speak to [facility] and it was ok to speak to her only about her medication. I was able to understand that and repeated that back to her to make sure that is what she wanted and she said yes and then cont to talk in which I was not able to understand

## 2022-06-09 ENCOUNTER — Telehealth: Payer: Self-pay

## 2022-06-09 NOTE — Telephone Encounter (Signed)
Daughter is calling in wondering if Dr.Tat would increase or change the medication Carbidopa-Levodopa ER (SINEMET CR) 25-100 MG tablet controlled release. Alexis Douglas said that the stiffness (neck is worse), weakness, increase shakes, difficulty feeding herself, easily choking when feeding, speech is worsening. Need to know if they need to come in or if they can try to increase the medication.

## 2022-06-09 NOTE — Telephone Encounter (Signed)
Talked to patients daughter about making a follow up appointment.putting patient on CX list

## 2022-06-14 NOTE — Progress Notes (Unsigned)
Assessment/Plan:   1.  Parkinsons Disease               -Biggest issue is multiple intolerances to medications/compliance.    We have done a levodopa challenge test and proved that the patient responded very well to levodopa, although she stated that she did not feel well on the immediate release.             -For now, she will continue the Rytary 245 mg, and told her she needed to try and increase the medication.  She will try to take 2 at 9am, 2 at 1pm, 1 at 5pm, 1 at 9pm.               -Patient declined the Duopa pump.             -Have grave concerns that if we do not change her medication or have her better medicated that she will need more extensive care, potentially even palliative or hospice care in the future.  I discussed this with her in detail again today.      2.  Bladder incontinence             -Intolerances to medications here have also been an issue.  She is awaiting a urology appointment in May. 3.  Sialorrhea  -Have discussed Myobloc.  She has not wanted to proceed thus far.   Subjective:   Alexis Douglas was seen today in follow up for Parkinsons disease.  My previous records were reviewed prior to todays visit as well as outside records available to me.  I last saw the patient in March, 2023.  She canceled her appointment last fall.  Despite not having seen her, we have received numerous phone calls from patient and family.  She is quite difficult to understand on the phone, so we have asked her husband to call on her behalf.  They called in June stating that she was having excessive drooling and we offered Myobloc.  She stated that she would call back if she wanted to proceed with that.  She never called back in regards to that.  They called in October stating that she had been "a lot worse since starting the Rytary."  We called them back stating that she had been on the [place] since August, 2022 so if her decline was since March, it could not be blamed on the Rytary.   They requested going off the [place]o and wanted to go back on the extended release levodopa, which she previously felt did not help her.  This was in October, and she had just missed an appointment with me in September.  Over the phone, we changed her back to the CR, at her request.  Her daughter, whom we have never met, then called in January, just a few days ago, stating that she felt we should increase the patient's medication.  She was advised to attend today's appointment, but we did not change it since we had not seen the patient in almost a year.  The patient was worked into an appointment today, however.  More trouble with feeding self.  More trouble with choking and feeding self.   Current prescribed movement disorder medications: carbidopa/levodopa 25/100 CR, 2 tablets at 8am, 2 tablets at 11am, 2 tablets at 2 pm, 2 tablets at 5pm.     Prior meds:Previous medication: Pramipexole (headache), carbidopa/levodopa immediate release (itching/nausea after one quarter of a tablet but was able to tolerate  it during levodopa challenge and we proved efficacy but pt declined RX after that), carbidopa/levodopa extended release;  rytary (felt caused dry eye and then went on it and tolerated fine but felt she didn't do well on it and requested to go back to CR); says that carbidopa/levodopa 50/200 caused vivid dreams/hallucinations.  ALLERGIES:   Allergies  Allergen Reactions   Cyclobenzaprine Nausea Only   Metronidazole Other (See Comments)    Unknown   Mirapex [Pramipexole Dihydrochloride] Other (See Comments)    HA   Moxifloxacin    Sulfamethoxazole Itching   Codeine Other (See Comments)    nervous   Nitrofurantoin Other (See Comments)    Could not tolerate   Nitrofurantoin Macrocrystal Rash   Prochlorperazine Other (See Comments)    hyper   Sulfa Antibiotics Itching and Rash   Sulfamethoxazole-Trimethoprim Rash   Tramadol Other (See Comments)    Deep sleep    CURRENT MEDICATIONS:   Outpatient Encounter Medications as of 06/15/2022  Medication Sig   Carbidopa-Levodopa ER (SINEMET CR) 25-100 MG tablet controlled release TAKE 2 TABLETS BY MOUTH FOUR TIMES DAILY   dexlansoprazole (DEXILANT) 60 MG capsule Take 60 mg by mouth daily.   dicyclomine (BENTYL) 10 MG capsule Take 10 mg by mouth every 6 (six) hours as needed.   estradiol (ESTRACE) 2 MG tablet Take 2 mg by mouth daily.   linaclotide (LINZESS) 72 MCG capsule Take 72 mcg by mouth daily.   methocarbamol (ROBAXIN) 500 MG tablet Take 500 mg by mouth as needed for muscle spasms.   No facility-administered encounter medications on file as of 06/15/2022.    Objective:   PHYSICAL EXAMINATION:    VITALS:   There were no vitals filed for this visit.  Wt Readings from Last 3 Encounters:  08/01/21 122 lb (55.3 kg)  06/22/20 114 lb (51.7 kg)  12/17/19 122 lb (55.3 kg)      GEN:  The patient appears stated age and is in NAD. HEENT:  Normocephalic, atraumatic.  The mucous membranes are moist.   Neurological examination:  Orientation: The patient is alert and oriented x3. Cranial nerves: There is good facial symmetry with significant facial hypomimia. The speech is fluent and dysarthric. Soft palate rises symmetrically and there is no tongue deviation. Hearing is intact to conversational tone. Sensation: Sensation is intact to light touch throughout Motor: Strength is at least antigravity x4.  Movement examination: Tone: There is moderate increased tone in the bilateral upper extremities, left greater than right (she is guarding the left arm/shoulder some) Abnormal movements: None seen Coordination:  There is significant slowness with all rapid alternating movements.  Difficult to tell whether there is decremation given the slowness.  She is unable to put her hands all the way to a fist to even do hand opening and closing. Gait and Station: We attempted to get her out of the chair with 2 person assist and patient  states that she just is unable.  States that she would fall backward.  Refuses to try.     Total time spent on today's visit was ***minutes, including both face-to-face time and nonface-to-face time.  Time included that spent on review of records (prior notes available to me/labs/imaging if pertinent), discussing treatment and goals, answering patient's questions and coordinating care.  Cc:  Myrlene Broker, MD

## 2022-06-15 ENCOUNTER — Other Ambulatory Visit (INDEPENDENT_AMBULATORY_CARE_PROVIDER_SITE_OTHER): Payer: Medicare Other

## 2022-06-15 ENCOUNTER — Encounter: Payer: Self-pay | Admitting: Neurology

## 2022-06-15 ENCOUNTER — Ambulatory Visit: Payer: Medicare Other | Admitting: Neurology

## 2022-06-15 VITALS — BP 100/62 | HR 66 | Ht 60.0 in | Wt 120.0 lb

## 2022-06-15 DIAGNOSIS — M6281 Muscle weakness (generalized): Secondary | ICD-10-CM | POA: Diagnosis not present

## 2022-06-15 DIAGNOSIS — G20A2 Parkinson's disease without dyskinesia, with fluctuations: Secondary | ICD-10-CM

## 2022-06-15 DIAGNOSIS — K117 Disturbances of salivary secretion: Secondary | ICD-10-CM | POA: Diagnosis not present

## 2022-06-15 DIAGNOSIS — R1310 Dysphagia, unspecified: Secondary | ICD-10-CM | POA: Diagnosis not present

## 2022-06-15 DIAGNOSIS — G20B1 Parkinson's disease with dyskinesia, without mention of fluctuations: Secondary | ICD-10-CM

## 2022-06-15 MED ORDER — MIDODRINE HCL 5 MG PO TABS: 5.0000 mg | ORAL_TABLET | Freq: Three times a day (TID) | ORAL | 0 refills | Status: DC

## 2022-06-15 NOTE — Patient Instructions (Addendum)
Your provider has requested that you have labwork completed today. The lab is located on the Second floor at [place], within the [facility] Endocrin[facility] office. When you get off the elevator, turn right and go in the [facility] [address]; the first brown door on the left.  Tell the ladies behind the desk that you are there for lab work. If you are not called within 15 minutes please check with the front desk.   Once you complete your labs you are free to go. You will receive a call or message via MyChart with your lab results.     March 15th at 2;15PM

## 2022-06-20 LAB — UA/M W/RFLX CULTURE, COMP
Bilirubin, UA: NEGATIVE
Glucose, UA: NEGATIVE
Nitrite, UA: NEGATIVE
RBC, UA: NEGATIVE
Specific Gravity, UA: 1.023 (ref 1.005–1.030)
Urobilinogen, Ur: 0.2 mg/dL (ref 0.2–1.0)
pH, UA: 6 (ref 5.0–7.5)

## 2022-06-20 LAB — MICROSCOPIC EXAMINATION
Bacteria, UA: NONE SEEN
Casts: NONE SEEN /lpf
Epithelial Cells (non renal): >10 /hpf — AB (ref 0–10)
RBC, Urine: NONE SEEN /hpf (ref 0–2)
WBC, UA: >30 /hpf — AB (ref 0–5)

## 2022-06-20 LAB — URINE CULTURE, COMPREHENSIVE

## 2022-06-21 LAB — CBC WITH DIFFERENTIAL/PLATELET
Absolute Monocytes: 695 cells/uL (ref 200–950)
Basophils Absolute: 40 cells/uL (ref 0–200)
Basophils Relative: 0.5 %
Eosinophils Absolute: 87 cells/uL (ref 15–500)
Eosinophils Relative: 1.1 %
HCT: 39.1 % (ref 35.0–45.0)
Hemoglobin: 13.3 g/dL (ref 11.7–15.5)
Lymphs Abs: 1943 cells/uL (ref 850–3900)
MCH: 29.8 pg (ref 27.0–33.0)
MCHC: 34 g/dL (ref 32.0–36.0)
MCV: 87.7 fL (ref 80.0–100.0)
MPV: 11.2 fL (ref 7.5–12.5)
Monocytes Relative: 8.8 %
Neutro Abs: 5135 cells/uL (ref 1500–7800)
Neutrophils Relative %: 65 %
Platelets: 251 10*3/uL (ref 140–400)
RBC: 4.46 10*6/uL (ref 3.80–5.10)
RDW: 14 % (ref 11.0–15.0)
Total Lymphocyte: 24.6 %
WBC: 7.9 10*3/uL (ref 3.8–10.8)

## 2022-06-21 LAB — COMPREHENSIVE METABOLIC PANEL
AG Ratio: 1.4 (calc) (ref 1.0–2.5)
ALT: 6 U/L (ref 6–29)
AST: 17 U/L (ref 10–35)
Albumin: 3.9 g/dL (ref 3.6–5.1)
Alkaline phosphatase (APISO): 66 U/L (ref 37–153)
BUN/Creatinine Ratio: 52 (calc) — ABNORMAL HIGH (ref 6–22)
BUN: 26 mg/dL — ABNORMAL HIGH (ref 7–25)
CO2: 27 mmol/L (ref 20–32)
Calcium: 9.5 mg/dL (ref 8.6–10.4)
Chloride: 105 mmol/L (ref 98–110)
Creat: 0.5 mg/dL — ABNORMAL LOW (ref 0.60–1.00)
Globulin: 2.7 g/dL (calc) (ref 1.9–3.7)
Glucose, Bld: 94 mg/dL (ref 65–99)
Potassium: 4.5 mmol/L (ref 3.5–5.3)
Sodium: 139 mmol/L (ref 135–146)
Total Bilirubin: 0.5 mg/dL (ref 0.2–1.2)
Total Protein: 6.6 g/dL (ref 6.1–8.1)

## 2022-06-21 LAB — MYASTHENIA GRAVIS PANEL 1
A CHR BINDING ABS: <0.3 nmol/L
STRIATED MUSCLE AB SCREEN: NEGATIVE

## 2022-06-29 ENCOUNTER — Ambulatory Visit: Payer: Medicare Other | Admitting: Neurology

## 2022-07-10 ENCOUNTER — Telehealth: Payer: Self-pay | Admitting: Neurology

## 2022-07-10 NOTE — Telephone Encounter (Signed)
Pt left a message with the access nurse stating she would like for someone to speak with her daughter about her medication at [phone].

## 2022-07-11 NOTE — Telephone Encounter (Signed)
Called patients husband and left a message for a call back.

## 2022-07-20 ENCOUNTER — Ambulatory Visit: Payer: Medicare Other | Admitting: Neurology

## 2022-07-27 NOTE — Progress Notes (Signed)
Assessment/Plan:   1.  Parkinsons Disease               -Biggest issue in the past have been multiple intolerances to medications and compliance with regimen.  This has been at my office and at other physician's offices as well.    We did a levodopa challenge test years ago and improved patient responded very well to levodopa, but patient stated that she did not feel well on the immediate release version.  This has left her going back and forth between other versions that have been unsuccessful.  She has not been seen now in my clinic in almost a year, and has gone dramatically downhill.  In fact, I talked to her last year about the fact that if things did not change, I saw her heading to palliative or hospice care, and I certainly think that we are nearing that.  She is no longer ambulatory.  I told her I had very little options.  Ultimately, she was agreeable to letting me start midodrine to raise up her blood pressure, as we could not change her levodopa until we fixed that.    -She was agreeable today to going back to changing to the immediate release carbidopa/levodopa 25/100, 2 tablets at 8 AM, 2 tablets at 11 AM, 2 tablets at 2 PM, 2 tablets at 5 PM.  We will need to watch her blood pressure closely given that she did not take the midodrine  -I am going to refer her for palliative care.  -Therapies were declined.  -Discussed [employer] for the speech.  She was not particularly interested.      2.  Bladder incontinence             -Now on prophylactic Keflex and was following with urology but has not been there since June, 2023 3.  Sialorrhea  -Have discussed Myobloc.  She has not wanted to proceed thus far.  4.  Candidiasis  -Patient has candidiasis on her neck where she is having difficulty holding the head up.  Primary care prescribed her fluconazole and she said she was not able to take it because it made her dizzy and sick (she also stated that midodrine  made her dizzy), but she actually only took half the dose the first time, thinking that it would make her sick before she even took it.  She is awaiting prescription for fungal cream.  5.  Cervical dystonia  -I think she has a fixed dystonic component now given the length of time this has been going on.  I do not think Botox would be much help, but we could not do it right now anyway given the fungal infection.   Subjective:   Alexis Douglas was seen today in follow up for Parkinsons disease.  My previous records were reviewed prior to todays visit as well as outside records available to me.  I started midodrine last visit, 5 mg 3 times per day.  The goal was to raise up her blood pressure so that hopefully we could change her levodopa.  I see that she called primary care physician about 3 weeks after we saw her to complain about dizziness, shortness of breath, chills, "left side of colon hurting" and wanted to know if the midodrine should be changed.  It does not appear that they did that, but they made her an appointment and she was seen on March 4.  When they saw her on  March 4, dizziness was not addressed.  The only thing that was addressed was the candidiasis infection that I had seen on her neck when I saw her in early February and told her to follow-up with the primary care for treatment.  She was given fluconazole.  She called back to primary care 7 days later and stated that she was not able to take the fluconazole because of the fact it made her dizzy and wanted a fungal cream instead.  Labs were done last visit, including myasthenic labs which were all negative.  We did this because of trouble holding the head up.  Urinalysis was done.  Urine culture itself was negative.  She was given Cipro by primary care, however, for her c/o UTI sx's.  They report today that her BP has been low at home but they are not sure if the cuff is accurate, as her BP is higher in the doctors office.    Pts husband  states today that the pt didn't take midodrine for long and "she didn't give it a chance."  Pt states it caused constipation and weak and sick in her stomach and legs.  He also states that when given Diflucan she only took half a tablet because she knew it would make her sick and then it did and she did not take anymore.  She was originally prescribed 1 tablet daily for 7 days.  He reports that the provider has then been out of the office and they are waiting for him to call in a fungal cream.  He was supposed to be back in the office today.   Current prescribed movement disorder medications: carbidopa/levodopa 25/100 CR, 2 tablets at 8am, 2 tablets at 11am, 2 tablets at 2 pm, 2 tablets at 5pm.  Midodrine, 5 mg 3 times per day.   Prior meds:Previous medication: Pramipexole (headache), carbidopa/levodopa immediate release (itching/nausea after one quarter of a tablet but was able to tolerate it during levodopa challenge and we proved efficacy but pt declined RX after that), carbidopa/levodopa extended release;  rytary (felt caused dry eye and then went on it and tolerated fine but felt she didn't do well on it and requested to go back to CR); says that carbidopa/levodopa 50/200 caused vivid dreams/hallucinations.  ALLERGIES:   Allergies  Allergen Reactions   Cyclobenzaprine Nausea Only   Metronidazole Other (See Comments)    Unknown   Mirapex [Pramipexole Dihydrochloride] Other (See Comments)    HA   Moxifloxacin    Sulfamethoxazole Itching   Codeine Other (See Comments)    nervous   Nitrofurantoin Other (See Comments)    Could not tolerate   Nitrofurantoin Macrocrystal Rash   Prochlorperazine Other (See Comments)    hyper   Sulfa Antibiotics Itching and Rash   Sulfamethoxazole-Trimethoprim Rash   Tramadol Other (See Comments)    Deep sleep    CURRENT MEDICATIONS:  Outpatient Encounter Medications as of 07/28/2022  Medication Sig   Carbidopa-Levodopa ER (SINEMET CR) 25-100 MG tablet  controlled release TAKE 2 TABLETS BY MOUTH FOUR TIMES DAILY   dexlansoprazole (DEXILANT) 60 MG capsule Take 60 mg by mouth daily.   dicyclomine (BENTYL) 10 MG capsule Take 10 mg by mouth every 6 (six) hours as needed.   estradiol (ESTRACE) 2 MG tablet Take 2 mg by mouth daily.   linaclotide (LINZESS) 72 MCG capsule Take 72 mcg by mouth daily.   methocarbamol (ROBAXIN) 500 MG tablet Take 500 mg by mouth as needed for muscle spasms.  midodrine (PROAMATINE) 5 MG tablet Take 1 tablet (5 mg total) by mouth 3 (three) times daily with meals.   No facility-administered encounter medications on file as of 07/28/2022.    Objective:   PHYSICAL EXAMINATION:    VITALS:   Vitals:   07/28/22 1418  BP: 116/78  Pulse: 83  SpO2: 99%  Weight: 120 lb (54.4 kg)  Height: 5' (1.524 m)     Wt Readings from Last 3 Encounters:  07/28/22 120 lb (54.4 kg)  06/15/22 120 lb (54.4 kg)  08/01/21 122 lb (55.3 kg)      GEN:  The patient appears stated age and is in NAD.  She is clearly frustrated (she and her husband are both frustrated with each other and the stresses of disease) HEENT:  Normocephalic, atraumatic.  The mucous membranes are moist.  Neck:  SB to the right. Skin: The fungal infection on the right neck is still present, but is better.  There is a bit on her right chin as well.  Neurological examination:  Orientation: The patient is alert and oriented x3. Cranial nerves: There is good facial symmetry with significant facial hypomimia. The speech is fluent and dysarthric.  She is very difficult to understand and it is virtually unintelligible most of the time.  Soft palate rises symmetrically and there is no tongue deviation. Hearing is intact to conversational tone. Sensation: Sensation is intact to light touch throughout Motor: Strength is at least antigravity x4.  Movement examination: Tone: There is severe increased tone in the right upper extremity and mild to moderate in the left upper  extremity. Abnormal movements: None seen Coordination:  There is significant slowness with all rapid alternating movements.  Difficult to tell whether there is decremation given the slowness.  She is unable to put her hands all the way to a fist to even do hand opening and closing.  The same is true with finger taps bilaterally.  This is stable from prior visit Gait and Station: States that she can no longer ambulate.     Total time spent on today's visit was 66minutes, including both face-to-face time and nonface-to-face time.  Time included that spent on review of records (prior notes available to me/labs/imaging if pertinent), discussing treatment and goals, answering patient's questions and coordinating care.  Cc:  Myrlene Broker, MD

## 2022-07-28 ENCOUNTER — Other Ambulatory Visit: Payer: Self-pay

## 2022-07-28 ENCOUNTER — Ambulatory Visit: Payer: Medicare Other | Admitting: Neurology

## 2022-07-28 ENCOUNTER — Encounter: Payer: Self-pay | Admitting: Neurology

## 2022-07-28 VITALS — BP 116/78 | HR 83 | Ht 60.0 in | Wt 120.0 lb

## 2022-07-28 DIAGNOSIS — R1310 Dysphagia, unspecified: Secondary | ICD-10-CM

## 2022-07-28 DIAGNOSIS — G20A2 Parkinson's disease without dyskinesia, with fluctuations: Secondary | ICD-10-CM | POA: Diagnosis not present

## 2022-07-28 DIAGNOSIS — G20B1 Parkinson's disease with dyskinesia, without mention of fluctuations: Secondary | ICD-10-CM

## 2022-07-28 DIAGNOSIS — G243 Spasmodic torticollis: Secondary | ICD-10-CM | POA: Diagnosis not present

## 2022-07-28 DIAGNOSIS — R471 Dysarthria and anarthria: Secondary | ICD-10-CM

## 2022-07-28 MED ORDER — CARBIDOPA-LEVODOPA 25-100 MG PO TABS: ORAL_TABLET | ORAL | 1 refills | Status: DC

## 2022-07-28 NOTE — Patient Instructions (Addendum)
STOP carbidopa/levodopa 25/100 CR START carbidopa/levodopa, 25/100,  2 tablets at 8 AM, 2 tablets at 11 AM, 2 tablets at 2 PM, 2 tablets at 5 PM Monitor your blood pressure

## 2022-08-07 ENCOUNTER — Ambulatory Visit: Payer: Medicare Other | Admitting: Neurology

## 2022-08-17 ENCOUNTER — Telehealth: Payer: Self-pay | Admitting: Anesthesiology

## 2022-08-17 ENCOUNTER — Other Ambulatory Visit: Payer: Self-pay

## 2022-08-17 ENCOUNTER — Other Ambulatory Visit: Payer: Self-pay | Admitting: Neurology

## 2022-08-17 DIAGNOSIS — G20B1 Parkinson's disease with dyskinesia, without mention of fluctuations: Secondary | ICD-10-CM

## 2022-08-17 MED ORDER — CARBIDOPA-LEVODOPA ER 25-100 MG PO TBCR: EXTENDED_RELEASE_TABLET | ORAL | 0 refills | Status: DC

## 2022-08-17 NOTE — Telephone Encounter (Signed)
Pt's husband called stating pt is not tolerating the carbidopa-levodopa well, when she takes it she gets spaced out, glassy eyes, states she is really out of it and afterwards agitated. Requests call back.

## 2022-08-17 NOTE — Telephone Encounter (Signed)
Called and spoke with patients husband and daughter after getting permission from patient to speak to daughter she is not on the DPR they are wanting to try the 25/100 CR which dose would you want to use. The last one I see in the system is the 2 tablets four times a day. Patients family also said that Hospice came out and said all they will do is come by and take patients vitals every two weeks with a doctor visit every month. Patient needing much more extensive care than that. Would you like me to look into home pt and ot ?

## 2022-11-21 ENCOUNTER — Other Ambulatory Visit: Payer: Self-pay | Admitting: Neurology

## 2022-11-21 DIAGNOSIS — G20B1 Parkinson's disease with dyskinesia, without mention of fluctuations: Secondary | ICD-10-CM

## 2022-11-23 ENCOUNTER — Encounter: Payer: Self-pay | Admitting: Neurology

## 2022-11-23 ENCOUNTER — Telehealth: Payer: Self-pay | Admitting: Neurology

## 2022-12-11 ENCOUNTER — Other Ambulatory Visit: Payer: Self-pay

## 2022-12-11 ENCOUNTER — Telehealth: Payer: Self-pay | Admitting: Neurology

## 2022-12-11 NOTE — Telephone Encounter (Signed)
Called [facility] and authorized hospice care. Talked to daughter and told her to let me know about the visit on August 22nd

## 2022-12-11 NOTE — Telephone Encounter (Signed)
Pt daughter called stated her dad has stated to burn out home health has been and they recommenced hospice care and she qualified for it. Pt has a back rash and a bed sore that is being treated. Pt is wondering if Dr Tat would still see the pt or if the Pt would just see the hospice doctor. With hospice care the pt will get nurse care and bath 2 times a week. Pt daughter stated that the pt anxiety as increased and that her memory has started to change. Pt daughter said that if Dr Tat will still see the pt if she can still see the pt and the can do a VV some the daughter will make sure that she is at the pt house to make sure that the appointment flows well,

## 2022-12-11 NOTE — Telephone Encounter (Signed)
Patient daughter wants to speak with someone about Hospice care coming into the house to help her. She has some questions about this ex: would she still see Dr Tat or have to see one of the Hospice providers. As well as other questions

## 2022-12-28 ENCOUNTER — Ambulatory Visit: Payer: Medicare Other | Admitting: Neurology

## 2023-01-04 ENCOUNTER — Ambulatory Visit: Payer: Medicare Other | Admitting: Neurology

## 2023-07-14 DEATH — deceased
